# Patient Record
Sex: Female | Born: 1980 | Race: White | Hispanic: No | Marital: Married | Smoking: Current every day smoker
Health system: Southern US, Community
[De-identification: ages and names within clinical notes are randomized; demographics above are authoritative.]

## PROBLEM LIST (undated history)

## (undated) ENCOUNTER — Inpatient Hospital Stay (HOSPITAL_COMMUNITY): Payer: Self-pay

## (undated) DIAGNOSIS — Z8759 Personal history of other complications of pregnancy, childbirth and the puerperium: Secondary | ICD-10-CM

## (undated) DIAGNOSIS — F329 Major depressive disorder, single episode, unspecified: Secondary | ICD-10-CM

## (undated) DIAGNOSIS — F32A Depression, unspecified: Secondary | ICD-10-CM

## (undated) DIAGNOSIS — Z972 Presence of dental prosthetic device (complete) (partial): Secondary | ICD-10-CM

## (undated) DIAGNOSIS — Q613 Polycystic kidney, unspecified: Secondary | ICD-10-CM

## (undated) DIAGNOSIS — N83209 Unspecified ovarian cyst, unspecified side: Secondary | ICD-10-CM

## (undated) HISTORY — DX: Unspecified ovarian cyst, unspecified side: N83.209

---

## 1999-03-26 ENCOUNTER — Inpatient Hospital Stay (HOSPITAL_COMMUNITY): Admission: AD | Admit: 1999-03-26 | Discharge: 1999-03-26 | Payer: Self-pay | Admitting: *Deleted

## 1999-05-01 ENCOUNTER — Emergency Department (HOSPITAL_COMMUNITY): Admission: EM | Admit: 1999-05-01 | Discharge: 1999-05-01 | Payer: Self-pay | Admitting: Emergency Medicine

## 1999-09-28 ENCOUNTER — Inpatient Hospital Stay (HOSPITAL_COMMUNITY): Admission: AD | Admit: 1999-09-28 | Discharge: 1999-09-30 | Payer: Self-pay | Admitting: Obstetrics & Gynecology

## 2000-06-17 ENCOUNTER — Inpatient Hospital Stay (HOSPITAL_COMMUNITY): Admission: AD | Admit: 2000-06-17 | Discharge: 2000-06-17 | Payer: Self-pay | Admitting: *Deleted

## 2000-12-13 ENCOUNTER — Inpatient Hospital Stay (HOSPITAL_COMMUNITY): Admission: AD | Admit: 2000-12-13 | Discharge: 2000-12-13 | Payer: Self-pay | Admitting: *Deleted

## 2000-12-13 ENCOUNTER — Encounter: Payer: Self-pay | Admitting: *Deleted

## 2000-12-22 ENCOUNTER — Inpatient Hospital Stay (HOSPITAL_COMMUNITY): Admission: AD | Admit: 2000-12-22 | Discharge: 2000-12-22 | Payer: Self-pay | Admitting: Obstetrics & Gynecology

## 2000-12-28 ENCOUNTER — Inpatient Hospital Stay (HOSPITAL_COMMUNITY): Admission: AD | Admit: 2000-12-28 | Discharge: 2000-12-28 | Payer: Self-pay | Admitting: Obstetrics

## 2001-01-05 ENCOUNTER — Encounter: Payer: Self-pay | Admitting: Obstetrics & Gynecology

## 2001-01-05 ENCOUNTER — Inpatient Hospital Stay (HOSPITAL_COMMUNITY): Admission: AD | Admit: 2001-01-05 | Discharge: 2001-01-05 | Payer: Self-pay | Admitting: Obstetrics & Gynecology

## 2001-01-06 ENCOUNTER — Inpatient Hospital Stay (HOSPITAL_COMMUNITY): Admission: AD | Admit: 2001-01-06 | Discharge: 2001-01-06 | Payer: Self-pay | Admitting: Obstetrics & Gynecology

## 2001-01-09 ENCOUNTER — Inpatient Hospital Stay (HOSPITAL_COMMUNITY): Admission: AD | Admit: 2001-01-09 | Discharge: 2001-01-09 | Payer: Self-pay | Admitting: Obstetrics & Gynecology

## 2001-01-11 ENCOUNTER — Inpatient Hospital Stay (HOSPITAL_COMMUNITY): Admission: AD | Admit: 2001-01-11 | Discharge: 2001-01-13 | Payer: Self-pay | Admitting: *Deleted

## 2001-02-07 ENCOUNTER — Inpatient Hospital Stay (HOSPITAL_COMMUNITY): Admission: AD | Admit: 2001-02-07 | Discharge: 2001-02-07 | Payer: Self-pay | Admitting: *Deleted

## 2001-04-05 ENCOUNTER — Encounter: Admission: RE | Admit: 2001-04-05 | Discharge: 2001-04-05 | Payer: Self-pay | Admitting: Psychiatry

## 2001-05-31 ENCOUNTER — Encounter: Admission: RE | Admit: 2001-05-31 | Discharge: 2001-05-31 | Payer: Self-pay | Admitting: *Deleted

## 2001-09-04 ENCOUNTER — Emergency Department (HOSPITAL_COMMUNITY): Admission: EM | Admit: 2001-09-04 | Discharge: 2001-09-04 | Payer: Self-pay | Admitting: *Deleted

## 2001-09-27 ENCOUNTER — Encounter: Admission: RE | Admit: 2001-09-27 | Discharge: 2001-09-27 | Payer: Self-pay | Admitting: *Deleted

## 2002-05-10 ENCOUNTER — Emergency Department (HOSPITAL_COMMUNITY): Admission: EM | Admit: 2002-05-10 | Discharge: 2002-05-10 | Payer: Self-pay | Admitting: Emergency Medicine

## 2002-05-18 ENCOUNTER — Emergency Department (HOSPITAL_COMMUNITY): Admission: EM | Admit: 2002-05-18 | Discharge: 2002-05-18 | Payer: Self-pay | Admitting: Emergency Medicine

## 2002-05-25 ENCOUNTER — Encounter: Admission: RE | Admit: 2002-05-25 | Discharge: 2002-05-25 | Payer: Self-pay | Admitting: *Deleted

## 2002-06-23 ENCOUNTER — Emergency Department (HOSPITAL_COMMUNITY): Admission: EM | Admit: 2002-06-23 | Discharge: 2002-06-23 | Payer: Self-pay

## 2002-08-24 ENCOUNTER — Encounter: Admission: RE | Admit: 2002-08-24 | Discharge: 2002-08-24 | Payer: Self-pay | Admitting: Psychiatry

## 2003-05-13 ENCOUNTER — Encounter: Admission: RE | Admit: 2003-05-13 | Discharge: 2003-06-26 | Payer: Self-pay | Admitting: Orthopedic Surgery

## 2003-08-10 ENCOUNTER — Ambulatory Visit (HOSPITAL_COMMUNITY): Admission: RE | Admit: 2003-08-10 | Discharge: 2003-08-10 | Payer: Self-pay | Admitting: Orthopedic Surgery

## 2003-11-12 ENCOUNTER — Emergency Department (HOSPITAL_COMMUNITY): Admission: EM | Admit: 2003-11-12 | Discharge: 2003-11-12 | Payer: Self-pay | Admitting: Family Medicine

## 2003-11-26 ENCOUNTER — Ambulatory Visit (HOSPITAL_COMMUNITY): Payer: Self-pay | Admitting: Psychiatry

## 2003-12-29 ENCOUNTER — Emergency Department (HOSPITAL_COMMUNITY): Admission: EM | Admit: 2003-12-29 | Discharge: 2003-12-29 | Payer: Self-pay | Admitting: Family Medicine

## 2004-01-01 ENCOUNTER — Emergency Department (HOSPITAL_COMMUNITY): Admission: EM | Admit: 2004-01-01 | Discharge: 2004-01-02 | Payer: Self-pay | Admitting: Emergency Medicine

## 2004-02-11 ENCOUNTER — Observation Stay (HOSPITAL_COMMUNITY): Admission: RE | Admit: 2004-02-11 | Discharge: 2004-02-11 | Payer: Self-pay | Admitting: General Surgery

## 2004-02-11 HISTORY — PX: CHOLECYSTECTOMY: SHX55

## 2004-07-19 ENCOUNTER — Emergency Department (HOSPITAL_COMMUNITY): Admission: EM | Admit: 2004-07-19 | Discharge: 2004-07-19 | Payer: Self-pay | Admitting: Emergency Medicine

## 2004-10-07 ENCOUNTER — Ambulatory Visit (HOSPITAL_COMMUNITY): Payer: Self-pay | Admitting: Psychiatry

## 2004-10-14 ENCOUNTER — Emergency Department (HOSPITAL_COMMUNITY): Admission: EM | Admit: 2004-10-14 | Discharge: 2004-10-15 | Payer: Self-pay | Admitting: Emergency Medicine

## 2006-03-05 IMAGING — RF DG CHOLANGIOGRAM OPERATIVE
1 series · 17 of 17 positions shown · non-contrast
Comparison: none

CLINICAL DATA: 24 year old with gallstones. 
 INTRAOPERATIVE CHOLANGIOGRAM:
 Multiple fluoroscopic spot images from an intraoperative cholangiogram demonstrate cannulation of the cystic duct. The common bile duct is within normal limits in caliber.  It tapers normally to the duodenum where there is contrast entering the duodenum.  No abnormality involving the visualized intrahepatic ducts.  No common bile duct stones are seen.

[Series 1: run · 17 of 17 slices shown]
[im 1/17]
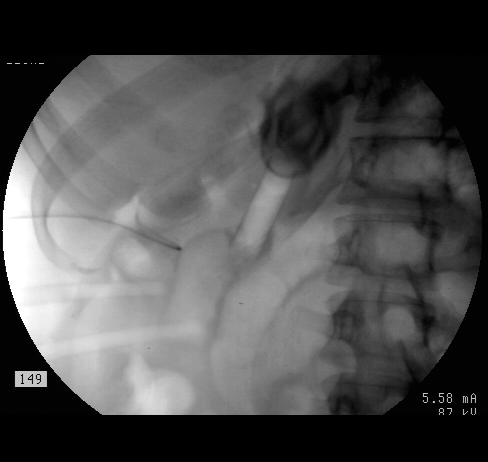
[im 2/17]
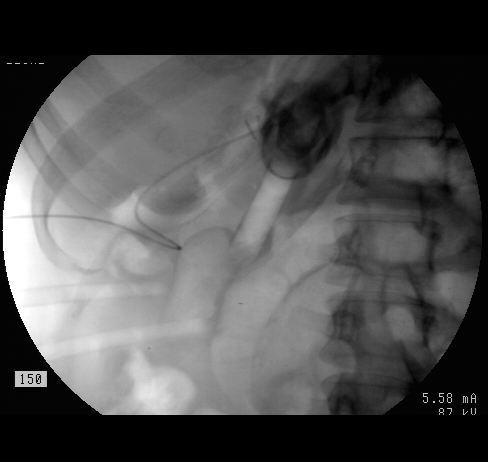
[im 3/17]
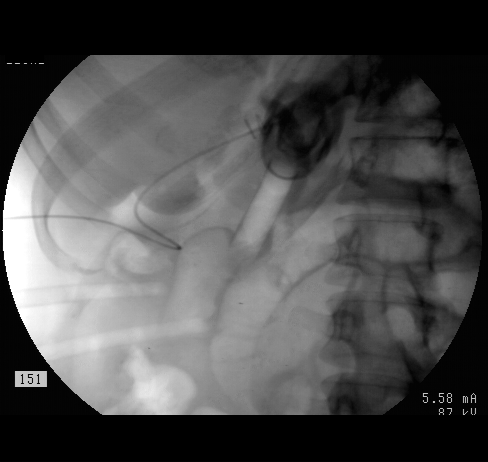
[im 4/17]
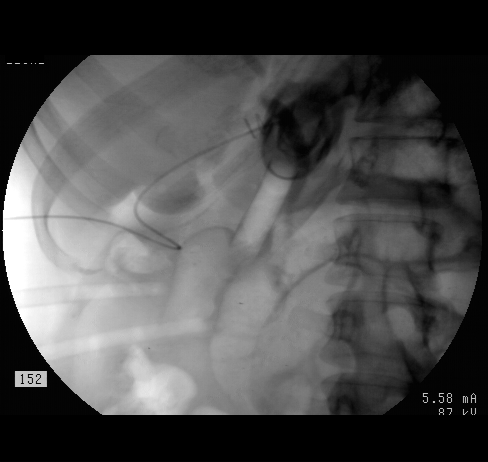
[im 5/17]
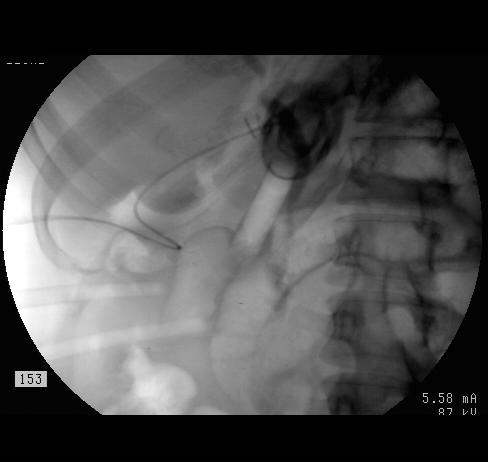
[im 6/17]
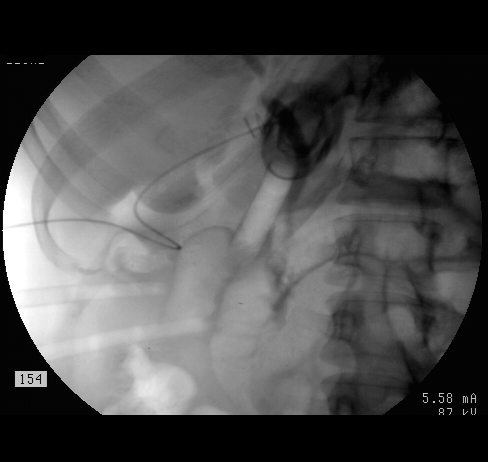
[im 7/17]
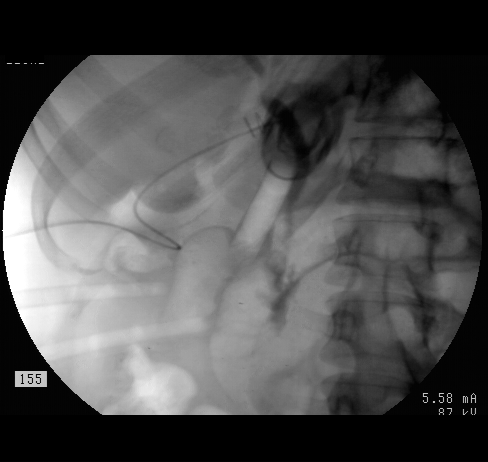
[im 8/17]
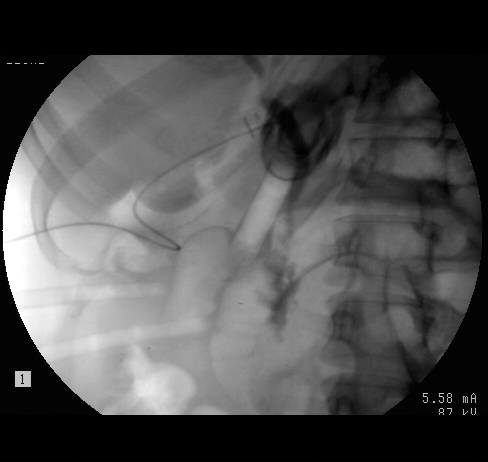
[im 9/17]
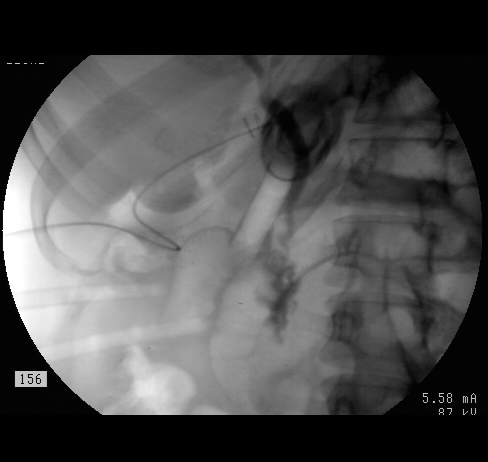
[im 10/17]
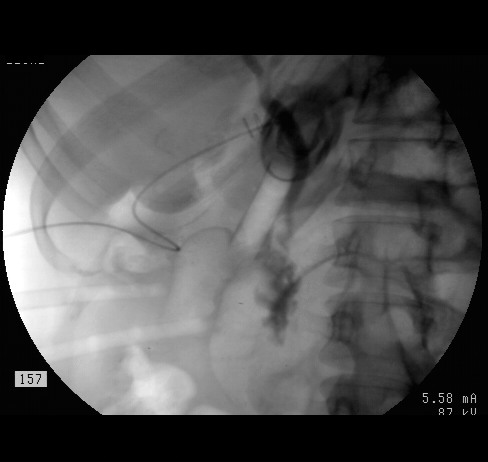
[im 11/17]
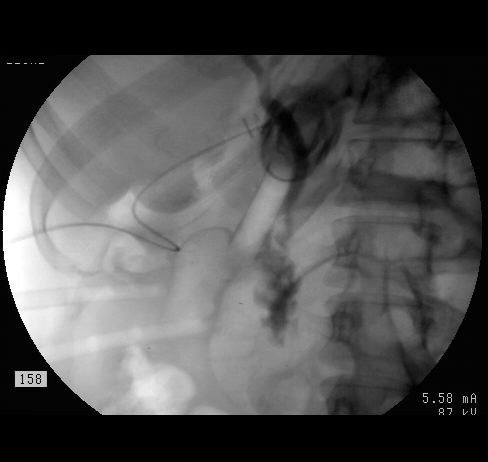
[im 12/17]
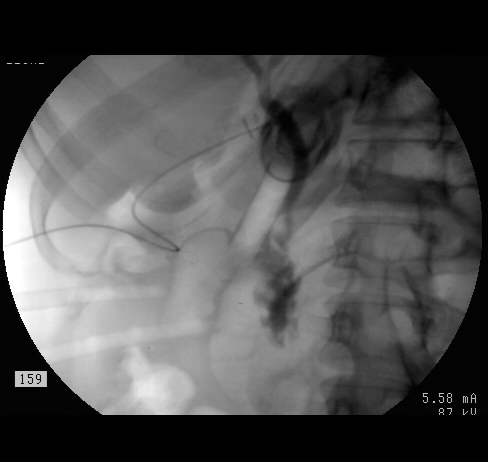
[im 13/17]
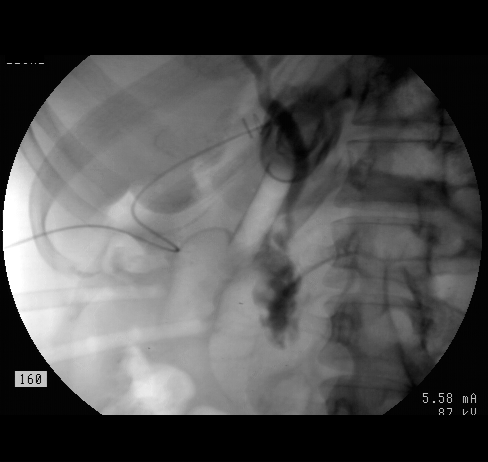
[im 14/17]
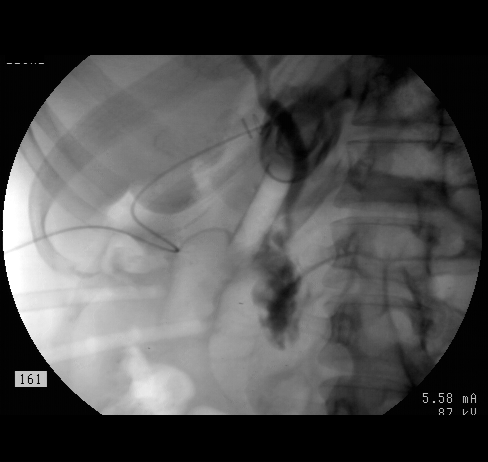
[im 15/17]
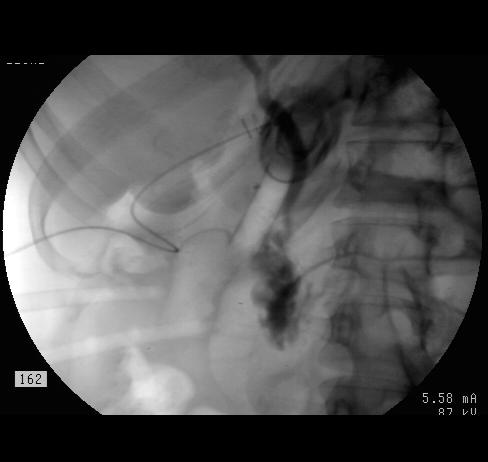
[im 16/17]
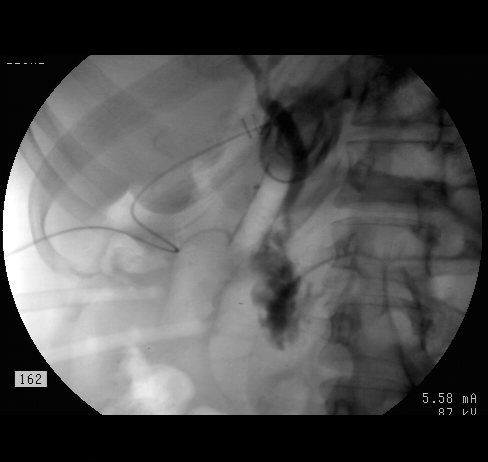
[im 17/17]
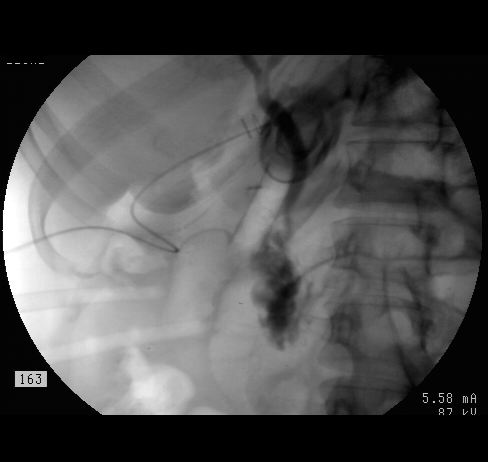

[17 of 17 positions shown; findings below may reference images not displayed]

IMPRESSION: Normal intraoperative cholangiogram.

## 2017-01-11 NOTE — L&D Delivery Note (Addendum)
Delivery Note At 12:08 PM a viable female was delivered via Vaginal, Spontaneous (Presentation: LOA ).  APGAR: 8, 9; weight pending  .   Placenta status: manual , complete .  Cord:  with the following complications: approx 20 mins after the delivery of the infant placenta had not delivered. Patient informs me that after her last birth her placenta didn't come, and had to be "scraped out". Dr Penne LashLeggett notified to come to the bedside to assist with placenta delivery and possible complications. Dr. Penne LashLeggett arrived. Placenta manually removed, and patient started to have heavy bleeding. Approx 1000 cc of clots and blood noted. PPH code called. Cytotec, hemabate given. Bakri balloon placed in the room. OR called to be on hold for the patient. Crossed for 2 units of blood.   Cord pH: NA  Anesthesia:  Epidural Episiotomy: None Lacerations: None Suture Repair: N/A Est. Blood Loss (mL):  1500 cc  Mom to postpartum.  Baby to Couplet care / Skin to Skin.  Michelle [name] 07/08/2017, 12:52 PM   Called for placenta in situ for 25 minutes.  Manual removal of placenta.  Uterus clear of membranes and placental remnants.  Fundus was firm.  LUS boggy and increased bleeding.  Vigorous bimanual massage.  Foley catheter already in the bladder.  Hemobate and cytotec ordered and administered.  After several large clots and continued bleeding, a PPH code was called (see orders).  Bakri balloon placed and filled with 400 cc infused.  Bleeding stopped.  EBL 1500 per RN staff.  Will watch on L & D for several hours.     Michelle LincolnKelly Leggett, MD

## 2017-06-18 ENCOUNTER — Encounter (HOSPITAL_COMMUNITY): Payer: Self-pay | Admitting: *Deleted

## 2017-06-18 ENCOUNTER — Inpatient Hospital Stay (HOSPITAL_COMMUNITY)
Admission: AD | Admit: 2017-06-18 | Discharge: 2017-06-18 | Disposition: A | Discharge status: Home / Self Care | Payer: Medicaid Other | Source: Ambulatory Visit | Attending: Obstetrics & Gynecology | Admitting: Obstetrics & Gynecology

## 2017-06-18 DIAGNOSIS — N760 Acute vaginitis: Secondary | ICD-10-CM | POA: Diagnosis not present

## 2017-06-18 DIAGNOSIS — O4703 False labor before 37 completed weeks of gestation, third trimester: Secondary | ICD-10-CM | POA: Insufficient documentation

## 2017-06-18 DIAGNOSIS — O479 False labor, unspecified: Secondary | ICD-10-CM

## 2017-06-18 DIAGNOSIS — O99333 Smoking (tobacco) complicating pregnancy, third trimester: Secondary | ICD-10-CM | POA: Insufficient documentation

## 2017-06-18 DIAGNOSIS — O26893 Other specified pregnancy related conditions, third trimester: Secondary | ICD-10-CM | POA: Diagnosis present

## 2017-06-18 DIAGNOSIS — O23593 Infection of other part of genital tract in pregnancy, third trimester: Secondary | ICD-10-CM | POA: Diagnosis not present

## 2017-06-18 DIAGNOSIS — O9989 Other specified diseases and conditions complicating pregnancy, childbirth and the puerperium: Secondary | ICD-10-CM

## 2017-06-18 DIAGNOSIS — B9689 Other specified bacterial agents as the cause of diseases classified elsewhere: Secondary | ICD-10-CM | POA: Insufficient documentation

## 2017-06-18 DIAGNOSIS — Z3A36 36 weeks gestation of pregnancy: Secondary | ICD-10-CM | POA: Diagnosis not present

## 2017-06-18 HISTORY — DX: Major depressive disorder, single episode, unspecified: F32.9

## 2017-06-18 HISTORY — DX: Depression, unspecified: F32.A

## 2017-06-18 LAB — CBC
HCT: 34.9 % — ABNORMAL LOW (ref 36.0–46.0)
Hemoglobin: 12.3 g/dL (ref 12.0–15.0)
MCH: 33.8 pg (ref 26.0–34.0)
MCHC: 35.2 g/dL (ref 30.0–36.0)
MCV: 95.9 fL (ref 78.0–100.0)
Platelets: 182 10*3/uL (ref 150–400)
RBC: 3.64 MIL/uL — ABNORMAL LOW (ref 3.87–5.11)
RDW: 13.7 % (ref 11.5–15.5)
WBC: 9.2 10*3/uL (ref 4.0–10.5)

## 2017-06-18 LAB — COMPREHENSIVE METABOLIC PANEL
ALBUMIN: 2.6 g/dL — AB (ref 3.5–5.0)
ALT: 13 U/L — ABNORMAL LOW (ref 14–54)
ANION GAP: 9 (ref 5–15)
AST: 24 U/L (ref 15–41)
Alkaline Phosphatase: 151 U/L — ABNORMAL HIGH (ref 38–126)
BILIRUBIN TOTAL: 0.5 mg/dL (ref 0.3–1.2)
BUN: 8 mg/dL (ref 6–20)
CALCIUM: 8.7 mg/dL — AB (ref 8.9–10.3)
CO2: 20 mmol/L — ABNORMAL LOW (ref 22–32)
Chloride: 109 mmol/L (ref 101–111)
Creatinine, Ser: 0.53 mg/dL (ref 0.44–1.00)
GFR calc Af Amer: >60 mL/min (ref >60)
GFR calc non Af Amer: >60 mL/min (ref >60)
GLUCOSE: 92 mg/dL (ref 65–99)
Potassium: 3.5 mmol/L (ref 3.5–5.1)
Sodium: 138 mmol/L (ref 135–145)
TOTAL PROTEIN: 5.5 g/dL — AB (ref 6.5–8.1)

## 2017-06-18 LAB — URINALYSIS, ROUTINE W REFLEX MICROSCOPIC
Bilirubin Urine: NEGATIVE
GLUCOSE, UA: NEGATIVE mg/dL
Ketones, ur: NEGATIVE mg/dL
Nitrite: NEGATIVE
PROTEIN: 30 mg/dL — AB
Specific Gravity, Urine: 1.011 (ref 1.005–1.030)
pH: 7 (ref 5.0–8.0)

## 2017-06-18 LAB — PROTEIN / CREATININE RATIO, URINE
Creatinine, Urine: 83 mg/dL
Protein Creatinine Ratio: 0.36 mg/mg{Cre} — ABNORMAL HIGH (ref 0.00–0.15)
Total Protein, Urine: 30 mg/dL

## 2017-06-18 LAB — WET PREP, GENITAL
SPERM: NONE SEEN
Trich, Wet Prep: NONE SEEN
Yeast Wet Prep HPF POC: NONE SEEN

## 2017-06-18 LAB — AMNISURE RUPTURE OF MEMBRANE (ROM) NOT AT ARMC: Amnisure ROM: NEGATIVE

## 2017-06-18 MED ORDER — METRONIDAZOLE 500 MG PO TABS: 500.0000 mg | ORAL_TABLET | Freq: Two times a day (BID) | ORAL | 0 refills | Status: DC

## 2017-06-18 NOTE — Discharge Instructions (Signed)
Bacterial Vaginosis °Bacterial vaginosis is a vaginal infection that occurs when the normal balance of bacteria in the vagina is disrupted. It results from an overgrowth of certain bacteria. This is the most common vaginal infection among women ages 15-44. °Because bacterial vaginosis increases your risk for STIs (sexually transmitted infections), getting treated can help reduce your risk for chlamydia, gonorrhea, herpes, and HIV (human immunodeficiency virus). Treatment is also important for preventing complications in pregnant women, because this condition can cause an early (premature) delivery. °What are the causes? °This condition is caused by an increase in harmful bacteria that are normally present in small amounts in the vagina. However, the reason that the condition develops is not fully understood. °What increases the risk? °The following factors may make you more likely to develop this condition: °· Having a new sexual partner or multiple sexual partners. °· Having unprotected sex. °· Douching. °· Having an intrauterine device (IUD). °· Smoking. °· Drug and alcohol abuse. °· Taking certain antibiotic medicines. °· Being pregnant. ° °You cannot get bacterial vaginosis from toilet seats, bedding, swimming pools, or contact with objects around you. °What are the signs or symptoms? °Symptoms of this condition include: °· Grey or white vaginal discharge. The discharge can also be watery or foamy. °· A fish-like odor with discharge, especially after sexual intercourse or during menstruation. °· Itching in and around the vagina. °· Burning or pain with urination. ° °Some women with bacterial vaginosis have no signs or symptoms. °How is this diagnosed? °This condition is diagnosed based on: °· Your medical history. °· A physical exam of the vagina. °· Testing a sample of vaginal fluid under a microscope to look for a large amount of bad bacteria or abnormal cells. Your health care provider may use a cotton swab  or a small wooden spatula to collect the sample. ° °How is this treated? °This condition is treated with antibiotics. These may be given as a pill, a vaginal cream, or a medicine that is put into the vagina (suppository). If the condition comes back after treatment, a second round of antibiotics may be needed. °Follow these instructions at home: °Medicines °· Take over-the-counter and prescription medicines only as told by your health care provider. °· Take or use your antibiotic as told by your health care provider. Do not stop taking or using the antibiotic even if you start to feel better. °General instructions °· If you have a female sexual partner, tell her that you have a vaginal infection. She should see her health care provider and be treated if she has symptoms. If you have a female sexual partner, he does not need treatment. °· During treatment: °? Avoid sexual activity until you finish treatment. °? Do not douche. °? Avoid alcohol as directed by your health care provider. °? Avoid breastfeeding as directed by your health care provider. °· Drink enough water and fluids to keep your urine clear or pale yellow. °· Keep the area around your vagina and rectum clean. °? Wash the area daily with warm water. °? Wipe yourself from front to back after using the toilet. °· Keep all follow-up visits as told by your health care provider. This is important. °How is this prevented? °· Do not douche. °· Wash the outside of your vagina with warm water only. °· Use protection when having sex. This includes latex condoms and dental dams. °· Limit how many sexual partners you have. To help prevent bacterial vaginosis, it is best to have sex with just   one partner (monogamous). °· Make sure you and your sexual partner are tested for STIs. °· Wear cotton or cotton-lined underwear. °· Avoid wearing tight pants and pantyhose, especially during summer. °· Limit the amount of alcohol that you drink. °· Do not use any products that  contain nicotine or tobacco, such as cigarettes and e-cigarettes. If you need help quitting, ask your health care provider. °· Do not use illegal drugs. °Where to find more information: °· Centers for Disease Control and Prevention: www.cdc.gov/std °· American Sexual Health Association (ASHA): www.ashastd.org °· U.S. Department of Health and Human Services, Office on Women's Health: www.womenshealth.gov/ or https://www.womenshealth.gov/a-z-topics/bacterial-vaginosis °Contact a health care provider if: °· Your symptoms do not improve, even after treatment. °· You have more discharge or pain when urinating. °· You have a fever. °· You have pain in your abdomen. °· You have pain during sex. °· You have vaginal bleeding between periods. °Summary °· Bacterial vaginosis is a vaginal infection that occurs when the normal balance of bacteria in the vagina is disrupted. °· Because bacterial vaginosis increases your risk for STIs (sexually transmitted infections), getting treated can help reduce your risk for chlamydia, gonorrhea, herpes, and HIV (human immunodeficiency virus). Treatment is also important for preventing complications in pregnant women, because the condition can cause an early (premature) delivery. °· This condition is treated with antibiotic medicines. These may be given as a pill, a vaginal cream, or a medicine that is put into the vagina (suppository). °This information is not intended to replace advice given to you by your health care provider. Make sure you discuss any questions you have with your health care provider. °Document Released: 12/28/2004 Document Revised: 05/03/2016 Document Reviewed: 09/13/2015 °Elsevier Interactive Patient Education © 2018 [employer] ° °Safe Medications in Pregnancy  ° °Acne: °Benzoyl Peroxide °Salicylic Acid ° °Backache/Headache: °Tylenol: 2 regular strength every 4 hours OR °             2 Extra strength every 6 hours ° °Colds/Coughs/Allergies: °Benadryl (alcohol free)  25 mg every 6 hours as needed °Breath right strips °Claritin °Cepacol throat lozenges °Chloraseptic throat spray °Cold-Eeze- up to three times per day °Cough drops, alcohol free °Flonase (by prescription only) °Guaifenesin °Mucinex °Robitussin DM (plain only, alcohol free) °Saline nasal spray/drops °Sudafed (pseudoephedrine) & Actifed ** use only after [redacted] weeks gestation and if you do not have high blood pressure °Tylenol °Vicks Vaporub °Zinc lozenges °Zyrtec  ° °Constipation: °Colace °Ducolax suppositories °Fleet enema °Glycerin suppositories °Metamucil °Milk of magnesia °Miralax °Senokot °Smooth move tea ° °Diarrhea: °Kaopectate °Imodium A-D ° °*NO pepto Bismol ° °Hemorrhoids: °Anusol °Anusol HC °Preparation H °Tucks ° °Indigestion: °Tums °Maalox °Mylanta °Zantac  °Pepcid ° °Insomnia: °Benadryl (alcohol free) 25mg every 6 hours as needed °Tylenol PM °Unisom, no Gelcaps ° °Leg Cramps: °Tums °MagGel ° °Nausea/Vomiting:  °Bonine °Dramamine °Emetrol °Ginger extract °Sea bands °Meclizine  °Nausea medication to take during pregnancy:  °Unisom (doxylamine succinate 25 mg tablets) Take one tablet daily at bedtime. If symptoms are not adequately controlled, the dose can be increased to a maximum recommended dose of two tablets daily (1/2 tablet in the morning, 1/2 tablet mid-afternoon and one at bedtime). °Vitamin B6 100mg tablets. Take one tablet twice a day (up to 200 mg per day). ° °Skin Rashes: °Aveeno products °Benadryl cream or 25mg every 6 hours as needed °Calamine Lotion °1% cortisone cream ° °Yeast infection: °Gyne-lotrimin 7 °Monistat 7 ° ° °**If taking multiple medications, please check labels to avoid duplicating the same active   ingredients °**take medication as directed on the label °** Do not exceed 4000 mg of tylenol in 24 hours °**Do not take medications that contain aspirin or ibuprofen ° ° ° ° °

## 2017-06-18 NOTE — MAU Note (Signed)
Just moved to the area and plans to go [facility]. Waiting on appt there. Having a lot of pelvic pressure and lower abd pain. Leaking clear fld for 2 days

## 2017-06-18 NOTE — MAU Provider Note (Signed)
History     CSN: [id number]  Arrival date and time: 06/18/17 2051   First Provider Initiated Contact with Patient 06/18/17 2208     Chief Complaint  Patient presents with  . Pelvic Pain  . Vaginal Discharge  . Abdominal Pain   HPI  Michelle [name] is a 37 y.o. G4P3003 at 2925w1d who presents with pressure, contractions and leaking of fluid. She states she started leaking fluid that was clear like water yesterday and it has continued today. She states she is having increased pelvic pressure and occasional contractions. Denies bleeding. Reports good fetal movement. She recently moved from [place] and has not established prenatal care in this area. Plans to [facility]. She denies any problems in the pregnancy and has had 3 previous vaginal deliveries. She denies any headaches, visual changes or epigastric pain.   OB History    Gravida  4   Para  3   Term  3   Preterm      AB      Living  3     SAB      TAB      Ectopic      Multiple      Live Births  3           Past Medical History:  Diagnosis Date  . Chronic kidney disease   . Depression     Past Surgical History:  Procedure Laterality Date  . CHOLECYSTECTOMY      No family history on file.  Social History   Tobacco Use  . Smoking status: Current Every Day Smoker  . Smokeless tobacco: Never Used  Substance Use Topics  . Alcohol use: Not on file  . Drug use: Not on file    Allergies: No Known Allergies  No medications prior to admission.    Review of Systems  Constitutional: Negative.  Negative for fatigue and fever.  HENT: Negative.   Eyes: Negative for visual disturbance.  Respiratory: Negative.  Negative for shortness of breath.   Cardiovascular: Negative.  Negative for chest pain.  Gastrointestinal: Positive for abdominal pain. Negative for constipation, diarrhea, nausea and vomiting.  Genitourinary: Positive for pelvic pain and vaginal discharge. Negative for dysuria and  vaginal bleeding.  Neurological: Negative.  Negative for dizziness and headaches.   Physical Exam   Blood pressure 123/74, pulse 77, temperature 98.3 F (36.8 C), resp. rate 18, height 5' (1.524 m), weight 170 lb (77.1 kg).  Patient Vitals for the past 24 hrs:  BP Temp Pulse Resp Height Weight  06/18/17 2245 123/82 - 85 - - -  06/18/17 2231 116/72 - 85 - - -  06/18/17 2201 121/71 - 72 - - -  06/18/17 2146 123/74 - 77 - - -  06/18/17 2132 (!) 107/92 - 82 - - -  06/18/17 2107 (!) 145/79 - 76 - - -  06/18/17 2106 - 98.3 F (36.8 C) - 18 5' (1.524 m) 170 lb (77.1 kg)   Physical Exam  Fetal Tracing:  Baseline: 125 Variability: moderate Accels: 15x15 Decels: none  Toco: none  Dilation: Fingertip Effacement (%): Thick Cervical Position: Posterior Exam by:: Cleone Slimaroline Neill CNM   MAU Course  Procedures Results for orders placed or performed during the hospital encounter of 06/18/17 (from the past 24 hour(s))  Urinalysis, Routine w reflex microscopic     Status: Abnormal   Collection Time: 06/18/17  9:12 PM  Result Value Ref Range   Color, Urine YELLOW YELLOW  APPearance HAZY (A) CLEAR   Specific Gravity, Urine 1.011 1.005 - 1.030   pH 7.0 5.0 - 8.0   Glucose, UA NEGATIVE NEGATIVE mg/dL   Hgb urine dipstick MODERATE (A) NEGATIVE   Bilirubin Urine NEGATIVE NEGATIVE   Ketones, ur NEGATIVE NEGATIVE mg/dL   Protein, ur 30 (A) NEGATIVE mg/dL   Nitrite NEGATIVE NEGATIVE   Leukocytes, UA MODERATE (A) NEGATIVE   RBC / HPF 11-20 0 - 5 RBC/hpf   WBC, UA 21-50 0 - 5 WBC/hpf   Bacteria, UA MANY (A) NONE SEEN   Squamous Epithelial / LPF 0-5 0 - 5   Mucus PRESENT   Protein / creatinine ratio, urine     Status: Abnormal   Collection Time: 06/18/17  9:39 PM  Result Value Ref Range   Creatinine, Urine 83.00 mg/dL   Total Protein, Urine 30 mg/dL   Protein Creatinine Ratio 0.36 (H) 0.00 - 0.15 mg/mg[Cre]  CBC     Status: Abnormal   Collection Time: 06/18/17  9:57 PM  Result Value  Ref Range   WBC 9.2 4.0 - 10.5 K/uL   RBC 3.64 (L) 3.87 - 5.11 MIL/uL   Hemoglobin 12.3 12.0 - 15.0 g/dL   HCT 09.6 (L) 04.5 - 40.9 %   MCV 95.9 78.0 - 100.0 fL   MCH 33.8 26.0 - 34.0 pg   MCHC 35.2 30.0 - 36.0 g/dL   RDW 81.1 91.4 - 78.2 %   Platelets 182 150 - 400 K/uL  Comprehensive metabolic panel     Status: Abnormal   Collection Time: 06/18/17  9:57 PM  Result Value Ref Range   Sodium 138 135 - 145 mmol/L   Potassium 3.5 3.5 - 5.1 mmol/L   Chloride 109 101 - 111 mmol/L   CO2 20 (L) 22 - 32 mmol/L   Glucose, Bld 92 65 - 99 mg/dL   BUN 8 6 - 20 mg/dL   Creatinine, Ser 9.56 0.44 - 1.00 mg/dL   Calcium 8.7 (L) 8.9 - 10.3 mg/dL   Total Protein 5.5 (L) 6.5 - 8.1 g/dL   Albumin 2.6 (L) 3.5 - 5.0 g/dL   AST 24 15 - 41 U/L   ALT 13 (L) 14 - 54 U/L   Alkaline Phosphatase 151 (H) 38 - 126 U/L   Total Bilirubin 0.5 0.3 - 1.2 mg/dL   GFR calc non Af Amer >60 >60 mL/min   GFR calc Af Amer >60 >60 mL/min   Anion gap 9 5 - 15  Wet prep, genital     Status: Abnormal   Collection Time: 06/18/17 10:19 PM  Result Value Ref Range   Yeast Wet Prep HPF POC NONE SEEN NONE SEEN   Trich, Wet Prep NONE SEEN NONE SEEN   Clue Cells Wet Prep HPF POC PRESENT (A) NONE SEEN   WBC, Wet Prep HPF POC MODERATE (A) NONE SEEN   Sperm NONE SEEN   Amnisure rupture of membrane (rom)not at [facility]     Status: None   Collection Time: 06/18/17 10:19 PM  Result Value Ref Range   Amnisure ROM NEGATIVE    MDM UA, UC CBC, CMP, PCR Amnisure Group B Strep Wet prep and gc/chlamydia  Results reviewed with Dr. Debroah Loop- ok to discharge patient home with preeclampsia precautions.   Assessment and Plan   1. Bacterial vaginosis   2. [redacted] weeks gestation of pregnancy   3. Braxton Hick's contraction    -Discharge home in stable condition -Rx for metronidazole given to patient -Preterm  labor and preeclampsia precautions discussed -Patient advised to follow-up with [facility]  to start prenatal care -Patient  may return to MAU as needed or if her condition were to change or worsen  Rolm Bookbinder CNM 06/18/2017, 10:08 PM

## 2017-06-18 NOTE — Progress Notes (Signed)
G4P3 @ 36.[redacted] wksga. Here dt LOF clear past two days. Just moved to [place] and plans to deliver with [facility] and pending appt with them. No records.   EFM applied.  Fern test:

## 2017-06-18 NOTE — MAU Note (Addendum)
Pt reports leaking of clear fluid and pelvic pressure x2 days. Pt reports intermittent cramping, reports good fetal movement. Denies vaginal bleeding. [place]

## 2017-06-20 LAB — GC/CHLAMYDIA PROBE AMP (~~LOC~~) NOT AT ARMC
CHLAMYDIA, DNA PROBE: NEGATIVE
NEISSERIA GONORRHEA: NEGATIVE

## 2017-06-21 LAB — CULTURE, BETA STREP (GROUP B ONLY)

## 2017-06-21 LAB — CULTURE, OB URINE

## 2017-06-22 ENCOUNTER — Telehealth: Payer: Self-pay | Admitting: Student

## 2017-06-22 DIAGNOSIS — O2343 Unspecified infection of urinary tract in pregnancy, third trimester: Secondary | ICD-10-CM

## 2017-06-22 MED ORDER — CEPHALEXIN 500 MG PO CAPS: 500.0000 mg | ORAL_CAPSULE | Freq: Four times a day (QID) | ORAL | 0 refills | Status: DC

## 2017-06-22 NOTE — Telephone Encounter (Signed)
Verified by name & DOB. Notified of positive urine culture. NKDA. Keflex sent to pharmacy.   Judeth HornLawrence, Kearney Evitt, NP

## 2017-06-23 ENCOUNTER — Other Ambulatory Visit (HOSPITAL_COMMUNITY)
Admission: RE | Admit: 2017-06-23 | Discharge: 2017-06-23 | Disposition: A | Discharge status: Home / Self Care | Payer: Medicaid Other | Source: Ambulatory Visit | Attending: Obstetrics & Gynecology | Admitting: Obstetrics & Gynecology

## 2017-06-23 ENCOUNTER — Encounter: Payer: Self-pay | Admitting: Obstetrics & Gynecology

## 2017-06-23 ENCOUNTER — Ambulatory Visit (INDEPENDENT_AMBULATORY_CARE_PROVIDER_SITE_OTHER): Payer: Medicaid Other | Admitting: Obstetrics & Gynecology

## 2017-06-23 DIAGNOSIS — Z3A00 Weeks of gestation of pregnancy not specified: Secondary | ICD-10-CM | POA: Insufficient documentation

## 2017-06-23 DIAGNOSIS — Z3483 Encounter for supervision of other normal pregnancy, third trimester: Secondary | ICD-10-CM

## 2017-06-23 DIAGNOSIS — Z3480 Encounter for supervision of other normal pregnancy, unspecified trimester: Secondary | ICD-10-CM | POA: Diagnosis present

## 2017-06-23 NOTE — Progress Notes (Signed)
  Subjective:    Lerry LinerJessica R Vannote is being seen today for her first obstetrical visit.  This is not a planned pregnancy. She is at 7085w6d gestation. Her obstetrical history is significant for advanced maternal age. Patient does not intend to breast feed. Pregnancy history fully reviewed.  Patient reports no complaints.  Review of Systems:   Review of Systems  Objective:     BP 130/83   Pulse 83   Wt 169 lb (76.7 kg)   BMI 33.01 kg/m  Physical Exam  Exam Heart- rrr Lungs- CTAB Abd- benign, gravid, FH 36 Cervix- 2/80/-2   Assessment:    Pregnancy: [id number] Patient Active Problem List   Diagnosis Date Noted  . Supervision of other normal pregnancy, antepartum 06/23/2017       Plan:     Initial labs drawn. Prenatal vitamins. Problem list reviewed and updated. Cervical cultures done today Labor precautions given Follow up in 1 weeks.   Allie BossierMyra C Kaheem Halleck 06/23/2017

## 2017-06-24 LAB — CERVICOVAGINAL ANCILLARY ONLY
CHLAMYDIA, DNA PROBE: NEGATIVE
Neisseria Gonorrhea: NEGATIVE

## 2017-06-26 LAB — CULTURE, BETA STREP (GROUP B ONLY)
MICRO NUMBER: 90710799
SPECIMEN QUALITY: ADEQUATE

## 2017-06-27 ENCOUNTER — Encounter: Payer: Self-pay | Admitting: *Deleted

## 2017-06-27 DIAGNOSIS — Z3480 Encounter for supervision of other normal pregnancy, unspecified trimester: Secondary | ICD-10-CM

## 2017-06-29 ENCOUNTER — Encounter: Payer: Self-pay | Admitting: Obstetrics and Gynecology

## 2017-06-29 ENCOUNTER — Ambulatory Visit (INDEPENDENT_AMBULATORY_CARE_PROVIDER_SITE_OTHER): Payer: Medicaid Other | Admitting: Obstetrics and Gynecology

## 2017-06-29 VITALS — BP 132/80 | HR 85 | Wt 172.0 lb

## 2017-06-29 DIAGNOSIS — Z3483 Encounter for supervision of other normal pregnancy, third trimester: Secondary | ICD-10-CM | POA: Diagnosis not present

## 2017-06-29 DIAGNOSIS — O09529 Supervision of elderly multigravida, unspecified trimester: Secondary | ICD-10-CM | POA: Insufficient documentation

## 2017-06-29 DIAGNOSIS — O09523 Supervision of elderly multigravida, third trimester: Secondary | ICD-10-CM | POA: Diagnosis not present

## 2017-06-29 DIAGNOSIS — Z3480 Encounter for supervision of other normal pregnancy, unspecified trimester: Secondary | ICD-10-CM

## 2017-06-29 NOTE — Progress Notes (Signed)
   PRENATAL VISIT NOTE  Subjective:  Michelle LinerJessica R [name] is a 37 y.o. G4P3003 at 3629w5d being seen today for ongoing prenatal care.  She is currently monitored for the following issues for this high-risk pregnancy and has Supervision of other normal pregnancy, antepartum on their problem list.  Patient reports no complaints.  Contractions: Irritability. Vag. Bleeding: None.  Movement: Present. Denies leaking of fluid.   The following portions of the patient's history were reviewed and updated as appropriate: allergies, current medications, past family history, past medical history, past social history, past surgical history and problem list. Problem list updated.  Objective:   Vitals:   06/29/17 1136  BP: 132/80  Pulse: 85  Weight: 172 lb (78 kg)    Fetal Status: Fetal Heart Rate (bpm): 141 Fundal Height: 38 cm Movement: Present  Presentation: Vertex  General:  Alert, oriented and cooperative. Patient is in no acute distress.  Skin: Skin is warm and dry. No rash noted.   Cardiovascular: Normal heart rate noted  Respiratory: Normal respiratory effort, no problems with respiration noted  Abdomen: Soft, gravid, appropriate for gestational age.  Pain/Pressure: Present     Pelvic: Cervical exam performed Dilation: 2 Effacement (%): 80 Station: -3  Extremities: Normal range of motion.  Edema: Mild pitting, slight indentation  Mental Status: Normal mood and affect. Normal behavior. Normal judgment and thought content.   Assessment and Plan:  Pregnancy: G4P3003 at 4729w5d  1. Supervision of other normal pregnancy, antepartum Patient is doing well without complaints   Term labor symptoms and general obstetric precautions including but not limited to vaginal bleeding, contractions, leaking of fluid and fetal movement were reviewed in detail with the patient. Please refer to After Visit Summary for other counseling recommendations.  Return in about 1 week (around 07/06/2017) for ROB.  No  future appointments.  Catalina AntiguaPeggy Madylyn Insco, MD

## 2017-07-08 ENCOUNTER — Encounter: Payer: Medicaid Other | Admitting: Advanced Practice Midwife

## 2017-07-08 ENCOUNTER — Other Ambulatory Visit: Payer: Self-pay

## 2017-07-08 ENCOUNTER — Inpatient Hospital Stay (HOSPITAL_COMMUNITY): Payer: Medicaid Other | Admitting: Certified Registered Nurse Anesthetist

## 2017-07-08 ENCOUNTER — Encounter (HOSPITAL_COMMUNITY)
Admission: AD | Disposition: A | Discharge status: Home / Self Care | Payer: Self-pay | Source: Home / Self Care | Attending: Obstetrics & Gynecology

## 2017-07-08 ENCOUNTER — Inpatient Hospital Stay (HOSPITAL_COMMUNITY)
Admission: AD | Admit: 2017-07-08 | Discharge: 2017-07-10 | DRG: 768 | Disposition: A | Discharge status: Home / Self Care | Payer: Medicaid Other | Attending: Obstetrics & Gynecology | Admitting: Obstetrics & Gynecology

## 2017-07-08 ENCOUNTER — Inpatient Hospital Stay (HOSPITAL_COMMUNITY): Payer: Medicaid Other | Admitting: Anesthesiology

## 2017-07-08 ENCOUNTER — Encounter (HOSPITAL_COMMUNITY): Payer: Self-pay

## 2017-07-08 DIAGNOSIS — Z3A39 39 weeks gestation of pregnancy: Secondary | ICD-10-CM | POA: Diagnosis not present

## 2017-07-08 DIAGNOSIS — D62 Acute posthemorrhagic anemia: Secondary | ICD-10-CM | POA: Diagnosis not present

## 2017-07-08 DIAGNOSIS — Z3480 Encounter for supervision of other normal pregnancy, unspecified trimester: Secondary | ICD-10-CM

## 2017-07-08 DIAGNOSIS — O09523 Supervision of elderly multigravida, third trimester: Secondary | ICD-10-CM

## 2017-07-08 DIAGNOSIS — O1494 Unspecified pre-eclampsia, complicating childbirth: Secondary | ICD-10-CM | POA: Diagnosis present

## 2017-07-08 DIAGNOSIS — O134 Gestational [pregnancy-induced] hypertension without significant proteinuria, complicating childbirth: Secondary | ICD-10-CM | POA: Diagnosis not present

## 2017-07-08 DIAGNOSIS — O9081 Anemia of the puerperium: Secondary | ICD-10-CM | POA: Diagnosis not present

## 2017-07-08 DIAGNOSIS — Z3482 Encounter for supervision of other normal pregnancy, second trimester: Secondary | ICD-10-CM | POA: Diagnosis present

## 2017-07-08 DIAGNOSIS — O99334 Smoking (tobacco) complicating childbirth: Secondary | ICD-10-CM | POA: Diagnosis present

## 2017-07-08 DIAGNOSIS — O09529 Supervision of elderly multigravida, unspecified trimester: Secondary | ICD-10-CM

## 2017-07-08 LAB — COMPREHENSIVE METABOLIC PANEL
ALBUMIN: 2.8 g/dL — AB (ref 3.5–5.0)
ALT: 14 U/L (ref 0–44)
AST: 25 U/L (ref 15–41)
Alkaline Phosphatase: 194 U/L — ABNORMAL HIGH (ref 38–126)
Anion gap: 11 (ref 5–15)
BUN: 10 mg/dL (ref 6–20)
CHLORIDE: 104 mmol/L (ref 98–111)
CO2: 18 mmol/L — AB (ref 22–32)
CREATININE: 0.45 mg/dL (ref 0.44–1.00)
Calcium: 8.2 mg/dL — ABNORMAL LOW (ref 8.9–10.3)
GFR calc Af Amer: >60 mL/min (ref >60)
GFR calc non Af Amer: >60 mL/min (ref >60)
GLUCOSE: 75 mg/dL (ref 70–99)
Potassium: 3.5 mmol/L (ref 3.5–5.1)
SODIUM: 133 mmol/L — AB (ref 135–145)
Total Bilirubin: 0.7 mg/dL (ref 0.3–1.2)
Total Protein: 5.8 g/dL — ABNORMAL LOW (ref 6.5–8.1)

## 2017-07-08 LAB — URINALYSIS, ROUTINE W REFLEX MICROSCOPIC
BILIRUBIN URINE: NEGATIVE
GLUCOSE, UA: NEGATIVE mg/dL
Ketones, ur: NEGATIVE mg/dL
NITRITE: NEGATIVE
Protein, ur: 100 mg/dL — AB
SPECIFIC GRAVITY, URINE: 1.01 (ref 1.005–1.030)
pH: 7 (ref 5.0–8.0)

## 2017-07-08 LAB — PROTIME-INR
INR: 1.01
Prothrombin Time: 13.2 seconds (ref 11.4–15.2)

## 2017-07-08 LAB — CBC
HCT: 33.5 % — ABNORMAL LOW (ref 36.0–46.0)
HEMATOCRIT: 39.1 % (ref 36.0–46.0)
HEMATOCRIT: 31.8 % — AB (ref 36.0–46.0)
Hemoglobin: 13.4 g/dL (ref 12.0–15.0)
Hemoglobin: 11.7 g/dL — ABNORMAL LOW (ref 12.0–15.0)
Hemoglobin: 11.1 g/dL — ABNORMAL LOW (ref 12.0–15.0)
MCH: 33 pg (ref 26.0–34.0)
MCH: 33.9 pg (ref 26.0–34.0)
MCH: 33.7 pg (ref 26.0–34.0)
MCHC: 34.3 g/dL (ref 30.0–36.0)
MCHC: 34.9 g/dL (ref 30.0–36.0)
MCHC: 34.9 g/dL (ref 30.0–36.0)
MCV: 96.3 fL (ref 78.0–100.0)
MCV: 97.1 fL (ref 78.0–100.0)
MCV: 96.7 fL (ref 78.0–100.0)
PLATELETS: 155 10*3/uL (ref 150–400)
Platelets: 172 10*3/uL (ref 150–400)
Platelets: 156 10*3/uL (ref 150–400)
RBC: 4.06 MIL/uL (ref 3.87–5.11)
RBC: 3.45 MIL/uL — ABNORMAL LOW (ref 3.87–5.11)
RBC: 3.29 MIL/uL — ABNORMAL LOW (ref 3.87–5.11)
RDW: 13.6 % (ref 11.5–15.5)
RDW: 13.9 % (ref 11.5–15.5)
RDW: 13.8 % (ref 11.5–15.5)
WBC: 10.6 10*3/uL — ABNORMAL HIGH (ref 4.0–10.5)
WBC: 9.7 10*3/uL (ref 4.0–10.5)
WBC: 22.2 10*3/uL — AB (ref 4.0–10.5)

## 2017-07-08 LAB — DIC (DISSEMINATED INTRAVASCULAR COAGULATION) PANEL
PROTHROMBIN TIME: 15.3 s — AB (ref 11.4–15.2)
SMEAR REVIEW: NONE SEEN

## 2017-07-08 LAB — DIC (DISSEMINATED INTRAVASCULAR COAGULATION)PANEL
D-Dimer, Quant: 1.55 ug/mL-FEU — ABNORMAL HIGH (ref 0.00–0.50)
Fibrinogen: 242 mg/dL (ref 210–475)
INR: 1.22
Platelets: 153 10*3/uL (ref 150–400)
aPTT: 36 seconds (ref 24–36)

## 2017-07-08 LAB — RPR: RPR Ser Ql: NONREACTIVE

## 2017-07-08 LAB — PREPARE RBC (CROSSMATCH)

## 2017-07-08 LAB — PROTEIN / CREATININE RATIO, URINE
Creatinine, Urine: 92 mg/dL
Protein Creatinine Ratio: 0.82 mg/mg{Cre} — ABNORMAL HIGH (ref 0.00–0.15)
Total Protein, Urine: 75 mg/dL

## 2017-07-08 LAB — POCT FERN TEST: POCT Fern Test: POSITIVE

## 2017-07-08 LAB — POCT PREGNANCY, URINE: Preg Test, Ur: POSITIVE — AB

## 2017-07-08 LAB — POSTPARTUM HEMORRHAGE PROTOCOL (BB NOTIFICATION)

## 2017-07-08 LAB — FIBRINOGEN: Fibrinogen: 348 mg/dL (ref 210–475)

## 2017-07-08 LAB — ABO/RH: ABO/RH(D): O POS

## 2017-07-08 LAB — APTT: APTT: 30 s (ref 24–36)

## 2017-07-08 SURGERY — DILATION AND CURETTAGE
Anesthesia: Epidural

## 2017-07-08 MED ORDER — PHENYLEPHRINE 40 MCG/ML (10ML) SYRINGE FOR IV PUSH (FOR BLOOD PRESSURE SUPPORT)
80.0000 ug | PREFILLED_SYRINGE | INTRAVENOUS | Status: DC | PRN
  Administered 2017-07-08: 80 ug via INTRAVENOUS
  Filled 2017-07-08: qty 5

## 2017-07-08 MED ORDER — LACTATED RINGERS IV SOLN
INTRAVENOUS | Status: DC
  Administered 2017-07-08: 04:00:00 via INTRAVENOUS

## 2017-07-08 MED ORDER — OXYTOCIN 40 UNITS IN LACTATED RINGERS INFUSION - SIMPLE MED
1.0000 m[IU]/min | INTRAVENOUS | Status: DC
  Administered 2017-07-08: 2 m[IU]/min via INTRAVENOUS
  Filled 2017-07-08: qty 1000

## 2017-07-08 MED ORDER — CARBOPROST TROMETHAMINE 250 MCG/ML IM SOLN: 250.0000 ug | INTRAMUSCULAR | Status: DC | PRN

## 2017-07-08 MED ORDER — SODIUM CHLORIDE 0.9% IV SOLUTION: Freq: Once | INTRAVENOUS | Status: DC

## 2017-07-08 MED ORDER — MISOPROSTOL 200 MCG PO TABS
400.0000 ug | ORAL_TABLET | Freq: Once | ORAL | Status: AC
  Administered 2017-07-08: 400 ug via BUCCAL
  Filled 2017-07-08: qty 2

## 2017-07-08 MED ORDER — PHENYLEPHRINE 40 MCG/ML (10ML) SYRINGE FOR IV PUSH (FOR BLOOD PRESSURE SUPPORT)
120.0000 ug | PREFILLED_SYRINGE | Freq: Once | INTRAVENOUS | Status: AC
  Administered 2017-07-08: 120 ug via INTRAVENOUS

## 2017-07-08 MED ORDER — CARBOPROST TROMETHAMINE 250 MCG/ML IM SOLN
INTRAMUSCULAR | Status: AC
  Administered 2017-07-08: 250 ug
  Filled 2017-07-08: qty 1

## 2017-07-08 MED ORDER — OXYTOCIN 40 UNITS IN LACTATED RINGERS INFUSION - SIMPLE MED: 2.5000 [IU]/h | INTRAVENOUS | Status: DC

## 2017-07-08 MED ORDER — EPHEDRINE 5 MG/ML INJ
10.0000 mg | INTRAVENOUS | Status: DC | PRN
  Filled 2017-07-08: qty 2

## 2017-07-08 MED ORDER — OXYTOCIN BOLUS FROM INFUSION
500.0000 mL | Freq: Once | INTRAVENOUS | Status: AC
  Administered 2017-07-08: 500 mL via INTRAVENOUS

## 2017-07-08 MED ORDER — DIPHENHYDRAMINE HCL 50 MG/ML IJ SOLN: 12.5000 mg | INTRAMUSCULAR | Status: DC | PRN

## 2017-07-08 MED ORDER — OXYCODONE HCL 5 MG PO TABS: 5.0000 mg | ORAL_TABLET | ORAL | Status: DC | PRN

## 2017-07-08 MED ORDER — COCONUT OIL OIL: 1.0000 "application " | TOPICAL_OIL | Status: DC | PRN

## 2017-07-08 MED ORDER — BENZOCAINE-MENTHOL 20-0.5 % EX AERO
1.0000 "application " | INHALATION_SPRAY | CUTANEOUS | Status: DC | PRN
  Filled 2017-07-08: qty 56

## 2017-07-08 MED ORDER — ACETAMINOPHEN 325 MG PO TABS: 650.0000 mg | ORAL_TABLET | ORAL | Status: DC | PRN

## 2017-07-08 MED ORDER — PRENATAL MULTIVITAMIN CH
1.0000 | ORAL_TABLET | Freq: Every day | ORAL | Status: DC
  Administered 2017-07-09 – 2017-07-10 (×2): 1 via ORAL
  Filled 2017-07-08 (×2): qty 1

## 2017-07-08 MED ORDER — DIPHENHYDRAMINE HCL 25 MG PO CAPS: 25.0000 mg | ORAL_CAPSULE | Freq: Four times a day (QID) | ORAL | Status: DC | PRN

## 2017-07-08 MED ORDER — OXYCODONE-ACETAMINOPHEN 5-325 MG PO TABS: 2.0000 | ORAL_TABLET | ORAL | Status: DC | PRN

## 2017-07-08 MED ORDER — ONDANSETRON HCL 4 MG/2ML IJ SOLN: 4.0000 mg | INTRAMUSCULAR | Status: DC | PRN

## 2017-07-08 MED ORDER — MISOPROSTOL 200 MCG PO TABS
ORAL_TABLET | ORAL | Status: AC
  Administered 2017-07-08: 13:00:00 via BUCCAL
  Filled 2017-07-08: qty 2

## 2017-07-08 MED ORDER — ONDANSETRON HCL 4 MG PO TABS: 4.0000 mg | ORAL_TABLET | ORAL | Status: DC | PRN

## 2017-07-08 MED ORDER — LACTATED RINGERS IV BOLUS: 500.0000 mL | Freq: Once | INTRAVENOUS | Status: DC

## 2017-07-08 MED ORDER — OXYCODONE HCL 5 MG PO TABS
10.0000 mg | ORAL_TABLET | ORAL | Status: DC | PRN
  Administered 2017-07-08 – 2017-07-09 (×2): 10 mg via ORAL
  Filled 2017-07-08 (×2): qty 2

## 2017-07-08 MED ORDER — DIBUCAINE 1 % RE OINT: 1.0000 "application " | TOPICAL_OINTMENT | RECTAL | Status: DC | PRN

## 2017-07-08 MED ORDER — WITCH HAZEL-GLYCERIN EX PADS: 1.0000 "application " | MEDICATED_PAD | CUTANEOUS | Status: DC | PRN

## 2017-07-08 MED ORDER — MISOPROSTOL 200 MCG PO TABS
ORAL_TABLET | ORAL | Status: AC
  Administered 2017-07-08: 200 ug via BUCCAL
  Filled 2017-07-08: qty 3

## 2017-07-08 MED ORDER — IBUPROFEN 600 MG PO TABS
600.0000 mg | ORAL_TABLET | Freq: Four times a day (QID) | ORAL | Status: DC
  Administered 2017-07-08 – 2017-07-10 (×8): 600 mg via ORAL
  Filled 2017-07-08 (×8): qty 1

## 2017-07-08 MED ORDER — ONDANSETRON HCL 4 MG/2ML IJ SOLN
4.0000 mg | Freq: Four times a day (QID) | INTRAMUSCULAR | Status: DC | PRN
  Administered 2017-07-08: 4 mg via INTRAVENOUS
  Filled 2017-07-08: qty 2

## 2017-07-08 MED ORDER — CARBOPROST TROMETHAMINE 250 MCG/ML IM SOLN: 250.0000 ug | Freq: Once | INTRAMUSCULAR | Status: DC

## 2017-07-08 MED ORDER — SIMETHICONE 80 MG PO CHEW: 80.0000 mg | CHEWABLE_TABLET | ORAL | Status: DC | PRN

## 2017-07-08 MED ORDER — TETANUS-DIPHTH-ACELL PERTUSSIS 5-2.5-18.5 LF-MCG/0.5 IM SUSP: 0.5000 mL | Freq: Once | INTRAMUSCULAR | Status: DC

## 2017-07-08 MED ORDER — FLEET ENEMA 7-19 GM/118ML RE ENEM: 1.0000 | ENEMA | Freq: Every day | RECTAL | Status: DC | PRN

## 2017-07-08 MED ORDER — PHENYLEPHRINE 40 MCG/ML (10ML) SYRINGE FOR IV PUSH (FOR BLOOD PRESSURE SUPPORT)
80.0000 ug | PREFILLED_SYRINGE | INTRAVENOUS | Status: DC | PRN
  Administered 2017-07-08: 120 ug via INTRAVENOUS
  Filled 2017-07-08: qty 10
  Filled 2017-07-08: qty 5

## 2017-07-08 MED ORDER — SOD CITRATE-CITRIC ACID 500-334 MG/5ML PO SOLN: 30.0000 mL | ORAL | Status: DC | PRN

## 2017-07-08 MED ORDER — MISOPROSTOL 200 MCG PO TABS: 800.0000 ug | ORAL_TABLET | Freq: Once | ORAL | Status: DC

## 2017-07-08 MED ORDER — LACTATED RINGERS IV SOLN: 500.0000 mL | Freq: Once | INTRAVENOUS | Status: DC

## 2017-07-08 MED ORDER — OXYCODONE-ACETAMINOPHEN 5-325 MG PO TABS: 1.0000 | ORAL_TABLET | ORAL | Status: DC | PRN

## 2017-07-08 MED ORDER — TERBUTALINE SULFATE 1 MG/ML IJ SOLN: 0.2500 mg | Freq: Once | INTRAMUSCULAR | Status: DC | PRN

## 2017-07-08 MED ORDER — SENNOSIDES-DOCUSATE SODIUM 8.6-50 MG PO TABS
2.0000 | ORAL_TABLET | ORAL | Status: DC
  Administered 2017-07-08 – 2017-07-09 (×2): 2 via ORAL
  Filled 2017-07-08 (×2): qty 2

## 2017-07-08 MED ORDER — FENTANYL 2.5 MCG/ML BUPIVACAINE 1/10 % EPIDURAL INFUSION (WH - ANES)
14.0000 mL/h | INTRAMUSCULAR | Status: DC | PRN
  Administered 2017-07-08: 14 mL/h via EPIDURAL
  Filled 2017-07-08: qty 100

## 2017-07-08 MED ORDER — LACTATED RINGERS IV SOLN
500.0000 mL | INTRAVENOUS | Status: DC | PRN
  Administered 2017-07-08: 1000 mL via INTRAVENOUS

## 2017-07-08 MED ORDER — LIDOCAINE HCL (PF) 1 % IJ SOLN
INTRAMUSCULAR | Status: DC | PRN
  Administered 2017-07-08 (×2): 5 mL via EPIDURAL

## 2017-07-08 MED ORDER — LIDOCAINE HCL (PF) 1 % IJ SOLN
30.0000 mL | INTRAMUSCULAR | Status: DC | PRN
  Filled 2017-07-08: qty 30

## 2017-07-08 MED ORDER — MEASLES, MUMPS & RUBELLA VAC ~~LOC~~ INJ
0.5000 mL | INJECTION | Freq: Once | SUBCUTANEOUS | Status: DC
  Filled 2017-07-08: qty 0.5

## 2017-07-08 NOTE — Anesthesia Pain Management Evaluation Note (Signed)
  CRNA Pain Management Visit Note  Patient: [name], 37 y.o., female  "Hello I am a member of the anesthesia team at [facility]. We have an anesthesia team available at all times to provide care throughout the hospital, including epidural management and anesthesia for C-section. I don't know your plan for the delivery whether it a natural birth, water birth, IV sedation, nitrous supplementation, doula or epidural, but we want to meet your pain goals."   1.Was your pain managed to your expectations on prior hospitalizations?   Yes   2.What is your expectation for pain management during this hospitalization?     Epidural  3.How can we help you reach that goal?   Record the patient's initial score and the patient's pain goal.   Pain: 0  Pain Goal: 8 [facility] wants you to be able to say your pain was always managed very well.  Laban EmperorMalinova,Michelle [name] 07/08/2017

## 2017-07-08 NOTE — Anesthesia Preprocedure Evaluation (Signed)
Anesthesia Evaluation  Patient identified by MRN, date of birth, ID band Patient awake    Reviewed: Allergy & Precautions, H&P , NPO status , Patient's Chart, lab work & pertinent test results  History of Anesthesia Complications Negative for: history of anesthetic complications  Airway Mallampati: II  TM Distance: >3 FB Neck ROM: full    Dental no notable dental hx. (+) Teeth Intact   Pulmonary neg pulmonary ROS, Current Smoker,    Pulmonary exam normal breath sounds clear to auscultation       Cardiovascular negative cardio ROS Normal cardiovascular exam Rhythm:regular Rate:Normal     Neuro/Psych negative neurological ROS  negative psych ROS   GI/Hepatic negative GI ROS, Neg liver ROS,   Endo/Other  negative endocrine ROS  Renal/GU negative Renal ROS  negative genitourinary   Musculoskeletal   Abdominal   Peds  Hematology negative hematology ROS (+)   Anesthesia Other Findings   Reproductive/Obstetrics (+) Pregnancy                             Anesthesia Physical Anesthesia Plan  ASA: II  Anesthesia Plan: Epidural   Post-op Pain Management:    Induction:   PONV Risk Score and Plan:   Airway Management Planned:   Additional Equipment:   Intra-op Plan:   Post-operative Plan:   Informed Consent: I have reviewed the patients History and Physical, chart, labs and discussed the procedure including the risks, benefits and alternatives for the proposed anesthesia with the patient or authorized representative who has indicated his/her understanding and acceptance.     Plan Discussed with:   Anesthesia Plan Comments:         Anesthesia Quick Evaluation  

## 2017-07-08 NOTE — Anesthesia Procedure Notes (Signed)
Epidural Patient location during procedure: OB  Staffing Anesthesiologist: Ghassan Coggeshall, MD Performed: anesthesiologist   Preanesthetic Checklist Completed: patient identified, site marked, surgical consent, pre-op evaluation, timeout performed, IV checked, risks and benefits discussed and monitors and equipment checked  Epidural Patient position: sitting Prep: DuraPrep Patient monitoring: heart rate, continuous pulse ox and blood pressure Approach: right paramedian Location: L3-L4 Injection technique: LOR saline  Needle:  Needle type: Tuohy  Needle gauge: 17 G Needle length: 9 cm and 9 Needle insertion depth: 6 cm Catheter type: closed end flexible Catheter size: 20 Guage Catheter at skin depth: 10 cm Test dose: negative  Assessment Events: blood not aspirated, injection not painful, no injection resistance, negative IV test and no paresthesia  Additional Notes Patient identified. Risks/Benefits/Options discussed with patient including but not limited to bleeding, infection, nerve damage, paralysis, failed block, incomplete pain control, headache, blood pressure changes, nausea, vomiting, reactions to medication both or allergic, itching and postpartum back pain. Confirmed with bedside nurse the patient's most recent platelet count. Confirmed with patient that they are not currently taking any anticoagulation, have any bleeding history or any family history of bleeding disorders. Patient expressed understanding and wished to proceed. All questions were answered. Sterile technique was used throughout the entire procedure. Please see nursing notes for vital signs. Test dose was given through epidural needle and negative prior to continuing to dose epidural or start infusion. Warning signs of high block given to the patient including shortness of breath, tingling/numbness in hands, complete motor block, or any concerning symptoms with instructions to call for help. Patient was given  instructions on fall risk and not to get out of bed. All questions and concerns addressed with instructions to call with any issues.     

## 2017-07-08 NOTE — H&P (Signed)
LABOR AND DELIVERY ADMISSION HISTORY AND PHYSICAL NOTE  Lerry LinerJessica R Vannote is a 37 y.o. female [id number] with IUP at 6555w0d by early U/S presenting for labor and ROM. She reports ROM around 0130.  She reports positive fetal movement. Denies any other concerns. Also denies headache, blurry vision, visual disturbances, N/V, SOB, chest pain, RUQ or epigastric pain. She has noted LE swelling.  Prenatal History/Complications: [facility]; [facility] Pregnancy complications:  - AMA - Desires permanent sterilization (sgined BLT papers on 6/3)  Past Medical History: Past Medical History:  Diagnosis Date  . Chronic kidney disease   . Depression     Past Surgical History: Past Surgical History:  Procedure Laterality Date  . CHOLECYSTECTOMY      Obstetrical History: OB History    Gravida  4   Para  3   Term  3   Preterm      AB      Living  3     SAB      TAB      Ectopic      Multiple      Live Births  3           Social History: Social History   Socioeconomic History  . Marital status: Single    Spouse name: Not on file  . Number of children: Not on file  . Years of education: Not on file  . Highest education level: Not on file  Occupational History  . Not on file  Social Needs  . Financial resource strain: Not on file  . Food insecurity:    Worry: Not on file    Inability: Not on file  . Transportation needs:    Medical: Not on file    Non-medical: Not on file  Tobacco Use  . Smoking status: Current Every Day Smoker  . Smokeless tobacco: Never Used  Substance and Sexual Activity  . Alcohol use: Not Currently    Frequency: Never  . Drug use: Never  . Sexual activity: Yes    Partners: Male  Lifestyle  . Physical activity:    Days per week: Not on file    Minutes per session: Not on file  . Stress: Not on file  Relationships  . Social connections:    Talks on phone: Not on file    Gets together: Not on file     Attends religious service: Not on file    Active member of club or organization: Not on file    Attends meetings of clubs or organizations: Not on file    Relationship status: Not on file  Other Topics Concern  . Not on file  Social History Narrative  . Not on file    Family History: Family History  Problem Relation Age of Onset  . Diabetes Maternal Grandmother   . Cancer Maternal Grandmother        cervical  . Endometriosis Mother     Allergies: No Known Allergies  Medications Prior to Admission  Medication Sig Dispense Refill Last Dose  . prenatal vitamin w/FE, FA (PRENATAL 1 + 1) 27-1 MG TABS tablet Take 1 tablet by mouth daily at 12 noon.   07/07/2017 at Unknown time     Review of Systems  All systems reviewed and negative except as stated in HPI  Physical Exam Blood pressure (!) 134/93, pulse 72, temperature (!) 97.3 F (36.3 C), temperature source Oral, resp. rate 18, height  5' (1.524 m), weight 168 lb 12.8 oz (76.6 kg), SpO2 98 %. General appearance: alert, oriented Lungs: normal respiratory effort Heart: regular rate Abdomen: soft, non-tender; gravid, FH appropriate for GA Extremities: 1+ BLE edema Presentation: cephalic Fetal monitoring: baseline rate 135, moderate variability, +acel, no decel Uterine activity: ctx difficult to trace Dilation: 4 Effacement (%): 90 Station: -2 Exam by:: Bari Mantis RN  Prenatal labs: ABO, Rh: --/--/O POS, O POS Performed at [facility], [address], [place], [place] [address]  [phone]/28 0320) Antibody: NEG (06/28 0320) Rubella:  immune RPR:   nonreactive HBsAg:   negative HIV:   nonreactive GC/Chlamydia: negative GBS:   negative Glucola: 123, normal Genetic screening:  Normal NIPS Anatomy US: normal  Prenatal Transfer Tool  Maternal Diabetes: No Genetic Screening: Normal Maternal Ultrasounds/Referrals: Normal Fetal Ultrasounds or other Referrals:  None Maternal Substance Abuse:  No Significant Maternal  Medications:  None Significant Maternal Lab Results: Lab values include: Group B Strep negative  Results for orders placed or performed during the hospital encounter of 07/08/17 (from the past 24 hour(s))  POCT fern test   Collection Time: 07/08/17  2:39 AM  Result Value Ref Range   POCT Fern Test Positive = ruptured amniotic membanes   Pregnancy, urine POC   Collection Time: 07/08/17  3:00 AM  Result Value Ref Range   Preg Test, Ur POSITIVE (A) NEGATIVE    Patient Active Problem List   Diagnosis Date Noted  . AMA (advanced maternal age) multigravida 35+ 06/29/2017  . Supervision of other normal pregnancy, antepartum 06/23/2017    Assessment: MAKALA FETTEROLF is a 37 y.o. G4P3003 at [redacted]w[redacted]d here for early labor and SROM@0130 .  #Labor: Expectant management for now. Will augment with Pitocin if needed  #Elevated BPs: she had elevated BP on 6/8, now again today. New dx of GHTN. Check CBC, CMP, UPC. She is asymptomatic   #Pain: Will get epidral #FWB: Cat I #ID:  GBS neg #MOF: breast #MOC: interval BTL (papers signed on 6/3)   Kandra Nicolas Degele 07/08/2017, 3:19 AM

## 2017-07-08 NOTE — Progress Notes (Signed)
Contraction pattern has spaced out FHT Cat I (135, mod variability, +acel, no decel)  --Start IV Pitocin 2 x2  Kandra NicolasJulie P. Wren Pryce, MD OB Fellow

## 2017-07-08 NOTE — MAU Note (Signed)
Pt reports SROM clear around 0130. Denies bleeding. +FM

## 2017-07-08 NOTE — Anesthesia Postprocedure Evaluation (Signed)
Anesthesia Post Note  Patient: Michelle [name]  Procedure(s) Performed: AN AD HOC LABOR EPIDURAL     Patient location during evaluation: Mother Baby Anesthesia Type: Epidural Level of consciousness: awake and alert Pain management: pain level controlled Vital Signs Assessment: post-procedure vital signs reviewed and stable Respiratory status: spontaneous breathing, nonlabored ventilation and respiratory function stable Cardiovascular status: stable Postop Assessment: no headache, no backache and epidural receding Anesthetic complications: no    Last Vitals:  Vitals:   07/08/17 1631 07/08/17 1730  BP: 124/86 130/84  Pulse: 79 77  Resp: 18 16  Temp: 37.3 C 37.1 C  SpO2: 100% 97%    Last Pain:  Vitals:   07/08/17 1730  TempSrc: Oral  PainSc: 3    Pain Goal: Patients Stated Pain Goal: 3 (07/08/17 1730)               Junious SilkGILBERT,Dempsey Knotek

## 2017-07-08 NOTE — Progress Notes (Signed)
Michelle LinerJessica R [name] is a 37 y.o. [phone] 2024G4P3003 at 764w0d  admitted for rupture of membranes  Subjective: Feeling pressure   Objective: BP 130/80   Pulse 73   Temp 98.1 F (36.7 C)   Resp 16   Ht 5' (1.524 m)   Wt 168 lb (76.2 kg)   SpO2 98%   BMI 32.81 kg/m  No intake/output data recorded. No intake/output data recorded.  FHT:  FHR: 140 bpm, variability: moderate,  accelerations:  Present,  decelerations:  Present early UC:   regular, every 2-3 minutes SVE:   Dilation: Lip/rim Effacement (%): 60, 70 Station: -2 Exam by:: m wilkins rnc  Labs: Lab Results  Component Value Date   WBC 10.6 (H) 07/08/2017   HGB 13.4 07/08/2017   HCT 39.1 07/08/2017   MCV 96.3 07/08/2017   PLT 172 07/08/2017    Assessment / Plan: Augmentation of labor, progressing well  Labor: Progressing normally Preeclampsia:  labs stable Fetal Wellbeing:  Category I Pain Control:  Labor support without medications I/D:  n/a Anticipated MOD:  NSVD  Thressa ShellerHeather Asanti Craigo 07/08/2017, 11:57 AM

## 2017-07-09 DIAGNOSIS — O1494 Unspecified pre-eclampsia, complicating childbirth: Secondary | ICD-10-CM | POA: Diagnosis present

## 2017-07-09 DIAGNOSIS — D62 Acute posthemorrhagic anemia: Secondary | ICD-10-CM | POA: Diagnosis not present

## 2017-07-09 LAB — CBC
HCT: 25.1 % — ABNORMAL LOW (ref 36.0–46.0)
HEMATOCRIT: 24.1 % — AB (ref 36.0–46.0)
HEMOGLOBIN: 8.5 g/dL — AB (ref 12.0–15.0)
Hemoglobin: 8.9 g/dL — ABNORMAL LOW (ref 12.0–15.0)
MCH: 34 pg (ref 26.0–34.0)
MCH: 34.3 pg — AB (ref 26.0–34.0)
MCHC: 35.5 g/dL (ref 30.0–36.0)
MCHC: 35.3 g/dL (ref 30.0–36.0)
MCV: 95.8 fL (ref 78.0–100.0)
MCV: 97.2 fL (ref 78.0–100.0)
PLATELETS: 144 10*3/uL — AB (ref 150–400)
Platelets: 158 10*3/uL (ref 150–400)
RBC: 2.62 MIL/uL — AB (ref 3.87–5.11)
RBC: 2.48 MIL/uL — ABNORMAL LOW (ref 3.87–5.11)
RDW: 13.8 % (ref 11.5–15.5)
RDW: 14 % (ref 11.5–15.5)
WBC: 15 10*3/uL — ABNORMAL HIGH (ref 4.0–10.5)
WBC: 13.2 10*3/uL — ABNORMAL HIGH (ref 4.0–10.5)

## 2017-07-09 MED ORDER — ENALAPRIL MALEATE 2.5 MG PO TABS
2.5000 mg | ORAL_TABLET | Freq: Every day | ORAL | Status: DC
  Administered 2017-07-09 – 2017-07-10 (×2): 2.5 mg via ORAL
  Filled 2017-07-09 (×3): qty 1

## 2017-07-09 NOTE — Progress Notes (Signed)
Mother of baby was referred for history of depression and anxiety. Referral screened out by CSW because per chart review, patient has no documented instances where she was experiencing symptoms of depression. No mention of the diagnosis in her prenatal record either.   Please contact CSW if mother of baby requests, if needs arise, or if mother of baby scores greater than a nine or answers yes to question ten on [place] Postpartum Depression Screen.   Edwin Dadaarol Johnothan Bascomb, MSW, LCSW-A Clinical Social Worker [facility] [phone]

## 2017-07-09 NOTE — Progress Notes (Signed)
100 cc water removed from Bakri balloon. Lochia scant; minimal drainage in Bakri drainage bag.

## 2017-07-09 NOTE — Progress Notes (Addendum)
Post Partum Day 1 Subjective: no complaints, tolerating PO and on bedrest Minimal output from Bakri  Objective: Blood pressure (!) 148/83, pulse 84, temperature 98.2 F (36.8 C), temperature source Oral, resp. rate 18, height 5' (1.524 m), weight 168 lb (76.2 kg), SpO2 97 %, unknown if currently breastfeeding.  Physical Exam:  General: alert, cooperative and appears stated age Lochia: appropriate Uterine Fundus: firm, NT DVT Evaluation: No evidence of DVT seen on physical exam.  Recent Labs    07/08/17 1455 07/09/17 0552  HGB 11.1* 8.9*  HCT 31.8* 25.1*    Assessment/Plan: Begin slow removal of Bakri Balloon Increase ambulation and see if pt. Has dizziness due to Acute blood loss anemia due to postpartum hemorrhage Pre-eclampsia without severe features--BP is up--will add Vasotec Plan for discharge tomorrow, Breastfeeding and Contraception BTL   LOS: 1 day   Reva Boresanya S Auriah Hollings 07/09/2017, 7:34 AM

## 2017-07-09 NOTE — Progress Notes (Signed)
100 cc removed from Bakri balloon. Minimal fluid in Bakri collection bag.

## 2017-07-09 NOTE — Progress Notes (Signed)
Bakri balloon only held 280 cc; Bakri discontinued.

## 2017-07-10 MED ORDER — ENALAPRIL MALEATE 2.5 MG PO TABS: 2.5000 mg | ORAL_TABLET | Freq: Every day | ORAL | 0 refills | Status: DC

## 2017-07-10 MED ORDER — IBUPROFEN 600 MG PO TABS: 600.0000 mg | ORAL_TABLET | Freq: Four times a day (QID) | ORAL | 0 refills | Status: DC

## 2017-07-10 MED ORDER — OXYCODONE HCL 5 MG PO TABS: 5.0000 mg | ORAL_TABLET | ORAL | 0 refills | Status: DC | PRN

## 2017-07-10 NOTE — Discharge Summary (Signed)
OB Discharge Summary     Patient Name: Michelle [name] DOB: 04-Jul-1980 MRN: [id number]  Date of admission: 07/08/2017 Delivering MD: Thressa Sheller D   Date of discharge: 07/10/2017  Admitting diagnosis: 39 WEEKS ROM Intrauterine pregnancy: [redacted]w[redacted]d     Secondary diagnosis:  Active Problems:   AMA (advanced maternal age) multigravida 35+   Normal labor   Postpartum hemorrhage   Acute blood loss anemia   Pre-eclampsia affecting childbirth  Additional problems:      Discharge diagnosis: Term Pregnancy Delivered, SPEC, Postpartum Hemorrhage                                                                                             Post partum procedures:Bakri Ballon  Augmentation: Pitocin  Complications: Hemorrhage>1072mL  Hospital course:  Pt was admitted with DX of SPEC. IOL secondary to [facility]. Received magnesium. TSVD complicated by postpartum hemorrhage. Tx with medical intervention plus Bakri balloon placement with good response. Barki balloon slowly decreased and removed after 24 hrs and no further bleeding. Magnesium x 24 hrs postpartum. Started in [place] with good response. Progressed to ambulating, voiding, tolerating diet, + flatus and good oral pain control. Felt amendable for discharge home on PPD # 2  Discharge instructions and medications reviewed with pt. Pt verbalized understanding   Physical exam  Vitals:   07/09/17 2322 07/10/17 0449 07/10/17 0903 07/10/17 1127  BP:  (!) 146/87 139/88 (!) 136/95  Pulse: 82 81 73 95  Resp: 16 16 18 18   Temp: 98.4 F (36.9 C) 98 F (36.7 C) 97.9 F (36.6 C) 99 F (37.2 C)  TempSrc: Oral  Oral Oral  SpO2: 97% 99% 99% 98%  Weight:      Height:       General: alert Lochia: appropriate Uterine Fundus: firm Incision: N/A DVT Evaluation: No evidence of DVT seen on physical exam. Labs: Lab Results  Component Value Date   WBC 13.2 (H) 07/09/2017   HGB 8.5 (L) 07/09/2017   HCT 24.1 (L) 07/09/2017   MCV 97.2  07/09/2017   PLT 158 07/09/2017   CMP Latest Ref Rng & Units 07/08/2017  Glucose 70 - 99 mg/dL 75  BUN 6 - 20 mg/dL 10  Creatinine 4.09 - 8.11 mg/dL 9.14  Sodium 782 - 956 mmol/L 133(L)  Potassium 3.5 - 5.1 mmol/L 3.5  Chloride 98 - 111 mmol/L 104  CO2 22 - 32 mmol/L 18(L)  Calcium 8.9 - 10.3 mg/dL 8.2(L)  Total Protein 6.5 - 8.1 g/dL 2.1(H)  Total Bilirubin 0.3 - 1.2 mg/dL 0.7  Alkaline Phos 38 - 126 U/L 194(H)  AST 15 - 41 U/L 25  ALT 0 - 44 U/L 14    Discharge instruction: per After Visit Summary and "Baby and Me Booklet".  After visit meds:  Allergies as of 07/10/2017   No Known Allergies     Medication List    TAKE these medications   enalapril 2.5 MG tablet Commonly known as:  VASOTEC Take 1 tablet (2.5 mg total) by mouth daily. Start taking on:  07/11/2017   ibuprofen 600 MG tablet Commonly known as:  ADVIL,MOTRIN Take 1 tablet (600 mg total) by mouth every 6 (six) hours.   oxyCODONE 5 MG immediate release tablet Commonly known as:  Oxy IR/ROXICODONE Take 1 tablet (5 mg total) by mouth every 4 (four) hours as needed (pain scale 4-7).   prenatal vitamin w/FE, FA 27-1 MG Tabs tablet Take 1 tablet by mouth daily at 12 noon.       Diet: routine diet  Activity: Advance as tolerated. Pelvic rest for 6 weeks.   Outpatient follow up: 1 week for BP check Follow up Appt:No future appointments. Follow up Visit:No follow-ups on file.  Postpartum contraception: Abstinence  Newborn Data: Live born female  Birth Weight: 6 lb 9.3 oz (2985 g) APGAR: 8, 9  Newborn Delivery   Birth date/time:  07/08/2017 12:08:00 Delivery type:  Vaginal, Spontaneous     Baby Feeding: Breast Disposition:home with mother   07/10/2017 Michelle StaggersMichael L Javonn Gauger, MD

## 2017-07-10 NOTE — Discharge Instructions (Signed)
Postpartum Care After Vaginal Delivery °The period of time right after you deliver your newborn is called the postpartum period. °What kind of medical care will I receive? °· You may continue to receive fluids and medicines through an IV tube inserted into one of your veins. °· If an incision was made near your vagina (episiotomy) or if you had some vaginal tearing during delivery, cold compresses may be placed on your episiotomy or your tear. This helps to reduce pain and swelling. °· You may be given a squirt bottle to use when you go to the bathroom. You may use this until you are comfortable wiping as usual. To use the squirt bottle, follow these steps: °? Before you urinate, fill the squirt bottle with warm water. Do not use hot water. °? After you urinate, while you are sitting on the toilet, use the squirt bottle to rinse the area around your urethra and vaginal opening. This rinses away any urine and blood. °? You may do this instead of wiping. As you start healing, you may use the squirt bottle before wiping yourself. Make sure to wipe gently. °? Fill the squirt bottle with clean water every time you use the bathroom. °· You will be given sanitary pads to wear. °How can I expect to feel? °· You may not feel the need to urinate for several hours after delivery. °· You will have some soreness and pain in your abdomen and vagina. °· If you are breastfeeding, you may have uterine contractions every time you breastfeed for up to several weeks postpartum. Uterine contractions help your uterus return to its normal size. °· It is normal to have vaginal bleeding (lochia) after delivery. The amount and appearance of lochia is often similar to a menstrual period in the first week after delivery. It will gradually decrease over the next few weeks to a dry, yellow-brown discharge. For most women, lochia stops completely by 6-8 weeks after delivery. Vaginal bleeding can vary from woman to woman. °· Within the first few  days after delivery, you may have breast engorgement. This is when your breasts feel heavy, full, and uncomfortable. Your breasts may also throb and feel hard, tightly stretched, warm, and tender. After this occurs, you may have milk leaking from your breasts. Your health care provider can help you relieve discomfort due to breast engorgement. Breast engorgement should go away within a few days. °· You may feel more sad or worried than normal due to hormonal changes after delivery. These feelings should not last more than a few days. If these feelings do not go away after several days, speak with your health care provider. °How should I care for myself? °· Tell your health care provider if you have pain or discomfort. °· Drink enough water to keep your urine clear or pale yellow. °· Wash your hands thoroughly with soap and water for at least 20 seconds after changing your sanitary pads, after using the toilet, and before holding or feeding your baby. °· If you are not breastfeeding, avoid touching your breasts a lot. Doing this can make your breasts produce more milk. °· If you become weak or lightheaded, or you feel like you might faint, ask for help before: °? Getting out of bed. °? Showering. °· Change your sanitary pads frequently. Watch for any changes in your flow, such as a sudden increase in volume, a change in color, the passing of large blood clots. If you pass a blood clot from your vagina, save it   to show to your health care provider. Do not flush blood clots down the toilet without having your health care provider look at them. °· Make sure that all your vaccinations are up to date. This can help protect you and your baby from getting certain diseases. You may need to have immunizations done before you leave the hospital. °· If desired, talk with your health care provider about methods of family planning or birth control (contraception). °How can I start bonding with my baby? °Spending as much time as  possible with your baby is very important. During this time, you and your baby can get to know each other and develop a bond. Having your baby stay with you in your room (rooming in) can give you time to get to know your baby. Rooming in can also help you become comfortable caring for your baby. Breastfeeding can also help you bond with your baby. °How can I plan for returning home with my baby? °· Make sure that you have a car seat installed in your vehicle. °? Your car seat should be checked by a certified car seat installer to make sure that it is installed safely. °? Make sure that your baby fits into the car seat safely. °· Ask your health care provider any questions you have about caring for yourself or your baby. Make sure that you are able to contact your health care provider with any questions after leaving the hospital. °This information is not intended to replace advice given to you by your health care provider. Make sure you discuss any questions you have with your health care provider. °Document Released: 10/25/2006 Document Revised: 06/02/2015 Document Reviewed: 12/02/2014 °Elsevier Interactive Patient Education © 2018 [employer] ° °

## 2017-07-11 LAB — BPAM RBC
BLOOD PRODUCT EXPIRATION DATE: 201907272359
Blood Product Expiration Date: 201907262359
ISSUE DATE / TIME: 201906281249
ISSUE DATE / TIME: 201906281249
UNIT TYPE AND RH: 5100
Unit Type and Rh: 5100

## 2017-07-11 LAB — TYPE AND SCREEN
ABO/RH(D): O POS
ANTIBODY SCREEN: NEGATIVE
UNIT DIVISION: 0
Unit division: 0

## 2017-07-12 ENCOUNTER — Ambulatory Visit (INDEPENDENT_AMBULATORY_CARE_PROVIDER_SITE_OTHER): Payer: Medicaid Other

## 2017-07-12 VITALS — BP 130/89 | HR 88

## 2017-07-12 DIAGNOSIS — Z0130 Encounter for examination of blood pressure without abnormal findings: Secondary | ICD-10-CM

## 2017-07-12 NOTE — Progress Notes (Signed)
Pt instructed by Scheryl DarterJames Arnold, MD to be seen in office for a BP check 1 week after delivery. Pt was seen today for a BP check after giving birth on 07/08/17. BP reading 130/89.   Discussed with pt that if she starts to experience visual changes and headaches to give our office a call. Pt states that she has BP monitor at home and will continue to check BP. Pt scheduled postpartum visit when she came in today.

## 2017-07-25 ENCOUNTER — Telehealth: Payer: Self-pay | Admitting: *Deleted

## 2017-07-25 NOTE — Telephone Encounter (Signed)
Patient would like to know when her tubal will be done. Patient stated that she has signed consent. Patient thought that it would be done in the office during Postpartum visit on 08/05/17 but I informed her that it is a surgery.

## 2017-08-04 ENCOUNTER — Encounter: Payer: Self-pay | Admitting: Obstetrics & Gynecology

## 2017-08-04 ENCOUNTER — Encounter (HOSPITAL_COMMUNITY): Payer: Self-pay

## 2017-08-04 ENCOUNTER — Ambulatory Visit (INDEPENDENT_AMBULATORY_CARE_PROVIDER_SITE_OTHER): Payer: Medicaid Other | Admitting: Obstetrics & Gynecology

## 2017-08-04 MED ORDER — MEDROXYPROGESTERONE ACETATE 150 MG/ML IM SUSP: 150.0000 mg | INTRAMUSCULAR | 0 refills | Status: DC

## 2017-08-04 NOTE — Progress Notes (Signed)
Post Partum Exam  Michelle LinerJessica R [name] is a 37 y.o. 554P4004 female who presents for a postpartum visit. She is 4 weeks postpartum following a spontaneous vaginal delivery. I have fully reviewed the prenatal and intrapartum course. The delivery was at 39 gestational weeks.  Anesthesia: epidural. Postpartum course has been unremarkable. Baby's course has been unremarkable. Baby is feeding by bottle using Gerber Gentle Formula. Bleeding which is dark brown. Bowel function is normal. Bladder function is normal. Patient is sexually active. Contraception method  Is pt desires BTS and consent was signed 06/13/17 Postpartum depression screening:neg  The following portions of the patient's history were reviewed and updated as appropriate: allergies, current medications, past family history, past medical history, past social history, past surgical history and problem list. Last pap smear done 1/19 and was Normal  Review of Systems Pertinent items are noted in HPI.    Objective:  unknown if currently breastfeeding.  General:  alert   Breasts:  inspection negative, no nipple discharge or bleeding, no masses or nodularity palpable  Lungs: clear to auscultation bilaterally  Heart:  regular rate and rhythm, S1, S2 normal, no murmur, click, rub or gallop  Abdomen: soft, non-tender; bowel sounds normal; no masses,  no organomegaly   Vulva:  normal  Vagina: not evaluated  Cervix:  Not evaluated  Corpus: not examined  Adnexa:  not evaluated  Rectal Exam: Not performed.        Assessment:   Normal postpartum exam. Pap smear not done at today's visit.   Plan:   1. Contraception: tubal ligation to be scheduled. I sent Rhea BleacherJacinda a message about this.  2. I have ordered depo provera. She will pick it up and get her shot in the near future.

## 2017-08-05 ENCOUNTER — Ambulatory Visit (INDEPENDENT_AMBULATORY_CARE_PROVIDER_SITE_OTHER): Payer: Medicaid Other | Admitting: *Deleted

## 2017-08-05 ENCOUNTER — Ambulatory Visit: Payer: Medicaid Other | Admitting: Advanced Practice Midwife

## 2017-08-05 DIAGNOSIS — Z3042 Encounter for surveillance of injectable contraceptive: Secondary | ICD-10-CM | POA: Diagnosis not present

## 2017-08-05 DIAGNOSIS — Z3202 Encounter for pregnancy test, result negative: Secondary | ICD-10-CM

## 2017-08-05 DIAGNOSIS — Z3009 Encounter for other general counseling and advice on contraception: Secondary | ICD-10-CM

## 2017-08-05 LAB — POCT URINE PREGNANCY: PREG TEST UR: NEGATIVE

## 2017-08-05 MED ORDER — MEDROXYPROGESTERONE ACETATE 150 MG/ML IM SUSP
150.0000 mg | Freq: Once | INTRAMUSCULAR | Status: AC
  Administered 2017-08-05: 150 mg via INTRAMUSCULAR

## 2017-08-05 NOTE — Progress Notes (Signed)
Pt here for Depo Provera 150 mg injection.  She is going to be scheduled for BTS but [facility] .  This is per VO Dr Marice Potterove.

## 2017-09-05 ENCOUNTER — Other Ambulatory Visit: Payer: Self-pay

## 2017-09-05 ENCOUNTER — Encounter (HOSPITAL_BASED_OUTPATIENT_CLINIC_OR_DEPARTMENT_OTHER): Payer: Self-pay | Admitting: *Deleted

## 2017-09-05 NOTE — Pre-Procedure Instructions (Signed)
To come for BMET and urine preg.

## 2017-09-09 ENCOUNTER — Encounter (HOSPITAL_BASED_OUTPATIENT_CLINIC_OR_DEPARTMENT_OTHER)
Admission: RE | Admit: 2017-09-09 | Discharge: 2017-09-09 | Disposition: A | Discharge status: Home / Self Care | Payer: Medicaid Other | Source: Ambulatory Visit | Attending: Obstetrics & Gynecology | Admitting: Obstetrics & Gynecology

## 2017-09-09 DIAGNOSIS — Z01812 Encounter for preprocedural laboratory examination: Secondary | ICD-10-CM | POA: Diagnosis present

## 2017-09-09 LAB — BASIC METABOLIC PANEL
Anion gap: 8 (ref 5–15)
BUN: 7 mg/dL (ref 6–20)
CHLORIDE: 106 mmol/L (ref 98–111)
CO2: 27 mmol/L (ref 22–32)
Calcium: 9.1 mg/dL (ref 8.9–10.3)
Creatinine, Ser: 0.86 mg/dL (ref 0.44–1.00)
GFR calc non Af Amer: >60 mL/min (ref >60)
Glucose, Bld: 69 mg/dL — ABNORMAL LOW (ref 70–99)
POTASSIUM: 4.5 mmol/L (ref 3.5–5.1)
SODIUM: 141 mmol/L (ref 135–145)

## 2017-09-09 LAB — POCT PREGNANCY, URINE: PREG TEST UR: NEGATIVE

## 2017-09-13 ENCOUNTER — Ambulatory Visit (HOSPITAL_BASED_OUTPATIENT_CLINIC_OR_DEPARTMENT_OTHER): Payer: Medicaid Other | Admitting: Certified Registered"

## 2017-09-13 ENCOUNTER — Other Ambulatory Visit: Payer: Self-pay

## 2017-09-13 ENCOUNTER — Encounter (HOSPITAL_COMMUNITY): Payer: Self-pay

## 2017-09-13 ENCOUNTER — Ambulatory Visit (HOSPITAL_BASED_OUTPATIENT_CLINIC_OR_DEPARTMENT_OTHER)
Admission: RE | Admit: 2017-09-13 | Discharge: 2017-09-13 | Disposition: A | Discharge status: Home / Self Care | Payer: Medicaid Other | Source: Ambulatory Visit | Attending: Obstetrics & Gynecology | Admitting: Obstetrics & Gynecology

## 2017-09-13 ENCOUNTER — Encounter (HOSPITAL_BASED_OUTPATIENT_CLINIC_OR_DEPARTMENT_OTHER): Payer: Self-pay

## 2017-09-13 ENCOUNTER — Encounter (HOSPITAL_BASED_OUTPATIENT_CLINIC_OR_DEPARTMENT_OTHER)
Admission: RE | Disposition: A | Discharge status: Home / Self Care | Payer: Self-pay | Source: Ambulatory Visit | Attending: Obstetrics & Gynecology

## 2017-09-13 DIAGNOSIS — F17210 Nicotine dependence, cigarettes, uncomplicated: Secondary | ICD-10-CM | POA: Insufficient documentation

## 2017-09-13 DIAGNOSIS — F329 Major depressive disorder, single episode, unspecified: Secondary | ICD-10-CM | POA: Insufficient documentation

## 2017-09-13 DIAGNOSIS — Q613 Polycystic kidney, unspecified: Secondary | ICD-10-CM | POA: Insufficient documentation

## 2017-09-13 DIAGNOSIS — Z302 Encounter for sterilization: Secondary | ICD-10-CM | POA: Insufficient documentation

## 2017-09-13 HISTORY — DX: Polycystic kidney, unspecified: Q61.3

## 2017-09-13 HISTORY — PX: LAPAROSCOPIC TUBAL LIGATION: SHX1937

## 2017-09-13 HISTORY — DX: Personal history of other complications of pregnancy, childbirth and the puerperium: Z87.59

## 2017-09-13 HISTORY — DX: Presence of dental prosthetic device (complete) (partial): Z97.2

## 2017-09-13 SURGERY — LIGATION, FALLOPIAN TUBE, LAPAROSCOPIC
Anesthesia: General | Site: Abdomen | Laterality: Bilateral

## 2017-09-13 MED ORDER — ONDANSETRON HCL 4 MG/2ML IJ SOLN
INTRAMUSCULAR | Status: AC
  Filled 2017-09-13: qty 2

## 2017-09-13 MED ORDER — OXYCODONE HCL 5 MG PO TABS
5.0000 mg | ORAL_TABLET | Freq: Once | ORAL | Status: AC | PRN
  Administered 2017-09-13: 5 mg via ORAL

## 2017-09-13 MED ORDER — MIDAZOLAM HCL 2 MG/2ML IJ SOLN
INTRAMUSCULAR | Status: DC | PRN
  Administered 2017-09-13: 2 mg via INTRAVENOUS

## 2017-09-13 MED ORDER — LIDOCAINE 2% (20 MG/ML) 5 ML SYRINGE
INTRAMUSCULAR | Status: DC | PRN
  Administered 2017-09-13: 60 mg via INTRAVENOUS

## 2017-09-13 MED ORDER — DEXAMETHASONE SODIUM PHOSPHATE 10 MG/ML IJ SOLN
INTRAMUSCULAR | Status: AC
  Filled 2017-09-13: qty 1

## 2017-09-13 MED ORDER — FENTANYL CITRATE (PF) 100 MCG/2ML IJ SOLN
INTRAMUSCULAR | Status: AC
  Filled 2017-09-13: qty 2

## 2017-09-13 MED ORDER — PROPOFOL 10 MG/ML IV BOLUS
INTRAVENOUS | Status: AC
  Filled 2017-09-13: qty 20

## 2017-09-13 MED ORDER — PROPOFOL 10 MG/ML IV BOLUS
INTRAVENOUS | Status: DC | PRN
  Administered 2017-09-13: 140 mg via INTRAVENOUS

## 2017-09-13 MED ORDER — FENTANYL CITRATE (PF) 100 MCG/2ML IJ SOLN
INTRAMUSCULAR | Status: DC | PRN
  Administered 2017-09-13: 100 ug via INTRAVENOUS

## 2017-09-13 MED ORDER — CEFAZOLIN SODIUM-DEXTROSE 2-3 GM-%(50ML) IV SOLR
INTRAVENOUS | Status: DC | PRN
  Administered 2017-09-13: 2 g via INTRAVENOUS

## 2017-09-13 MED ORDER — ROCURONIUM BROMIDE 10 MG/ML (PF) SYRINGE
PREFILLED_SYRINGE | INTRAVENOUS | Status: DC | PRN
  Administered 2017-09-13: 50 mg via INTRAVENOUS

## 2017-09-13 MED ORDER — SCOPOLAMINE 1 MG/3DAYS TD PT72: 1.0000 | MEDICATED_PATCH | Freq: Once | TRANSDERMAL | Status: DC | PRN

## 2017-09-13 MED ORDER — FENTANYL CITRATE (PF) 100 MCG/2ML IJ SOLN: 50.0000 ug | INTRAMUSCULAR | Status: DC | PRN

## 2017-09-13 MED ORDER — ROCURONIUM BROMIDE 50 MG/5ML IV SOSY
PREFILLED_SYRINGE | INTRAVENOUS | Status: AC
  Filled 2017-09-13: qty 5

## 2017-09-13 MED ORDER — MIDAZOLAM HCL 2 MG/2ML IJ SOLN
INTRAMUSCULAR | Status: AC
  Filled 2017-09-13: qty 2

## 2017-09-13 MED ORDER — GLYCOPYRROLATE 0.2 MG/ML IJ SOLN
INTRAMUSCULAR | Status: DC | PRN
  Administered 2017-09-13: 0.2 mg via INTRAVENOUS

## 2017-09-13 MED ORDER — OXYCODONE HCL 5 MG PO TABS
ORAL_TABLET | ORAL | Status: AC
  Filled 2017-09-13: qty 1

## 2017-09-13 MED ORDER — IBUPROFEN 600 MG PO TABS: 600.0000 mg | ORAL_TABLET | Freq: Four times a day (QID) | ORAL | 1 refills | Status: DC | PRN

## 2017-09-13 MED ORDER — LIDOCAINE 2% (20 MG/ML) 5 ML SYRINGE
INTRAMUSCULAR | Status: AC
  Filled 2017-09-13: qty 5

## 2017-09-13 MED ORDER — EPHEDRINE SULFATE 50 MG/ML IJ SOLN
INTRAMUSCULAR | Status: DC | PRN
  Administered 2017-09-13: 15 mg via INTRAVENOUS

## 2017-09-13 MED ORDER — OXYCODONE-ACETAMINOPHEN 5-325 MG PO TABS: 1.0000 | ORAL_TABLET | Freq: Four times a day (QID) | ORAL | 0 refills | Status: DC | PRN

## 2017-09-13 MED ORDER — LACTATED RINGERS IV SOLN
INTRAVENOUS | Status: DC
  Administered 2017-09-13: 11:00:00 via INTRAVENOUS

## 2017-09-13 MED ORDER — KETOROLAC TROMETHAMINE 30 MG/ML IJ SOLN
INTRAMUSCULAR | Status: AC
  Filled 2017-09-13: qty 1

## 2017-09-13 MED ORDER — LACTATED RINGERS IV SOLN
INTRAVENOUS | Status: DC | PRN
  Administered 2017-09-13 (×2): via INTRAVENOUS

## 2017-09-13 MED ORDER — SUGAMMADEX SODIUM 500 MG/5ML IV SOLN
INTRAVENOUS | Status: DC | PRN
  Administered 2017-09-13: 300 mg via INTRAVENOUS

## 2017-09-13 MED ORDER — FENTANYL CITRATE (PF) 100 MCG/2ML IJ SOLN
25.0000 ug | INTRAMUSCULAR | Status: DC | PRN
  Administered 2017-09-13: 25 ug via INTRAVENOUS
  Administered 2017-09-13: 50 ug via INTRAVENOUS
  Administered 2017-09-13 (×3): 25 ug via INTRAVENOUS

## 2017-09-13 MED ORDER — MIDAZOLAM HCL 2 MG/2ML IJ SOLN: 1.0000 mg | INTRAMUSCULAR | Status: DC | PRN

## 2017-09-13 MED ORDER — BUPIVACAINE HCL (PF) 0.5 % IJ SOLN
INTRAMUSCULAR | Status: DC | PRN
  Administered 2017-09-13: 20 mL

## 2017-09-13 MED ORDER — OXYCODONE HCL 5 MG/5ML PO SOLN: 5.0000 mg | Freq: Once | ORAL | Status: AC | PRN

## 2017-09-13 SURGICAL SUPPLY — 41 items
BANDAGE ADH SHEER 1  50/CT (GAUZE/BANDAGES/DRESSINGS) IMPLANT
BRIEF STRETCH FOR OB PAD XXL (UNDERPADS AND DIAPERS) ×3 IMPLANT
CLIP FILSHIE TUBAL LIGA STRL (Clip) ×2 IMPLANT
CLOSURE WOUND 1/2 X4 (GAUZE/BANDAGES/DRESSINGS) ×1
DRSG OPSITE POSTOP 3X4 (GAUZE/BANDAGES/DRESSINGS) ×2 IMPLANT
DURAPREP 26ML APPLICATOR (WOUND CARE) ×3 IMPLANT
ELECT REM PT RETURN 9FT ADLT (ELECTROSURGICAL) ×3
ELECTRODE REM PT RTRN 9FT ADLT (ELECTROSURGICAL) IMPLANT
GLOVE BIO SURGEON STRL SZ 6.5 (GLOVE) ×3 IMPLANT
GLOVE BIO SURGEONS STRL SZ 6.5 (GLOVE) ×2
GLOVE BIOGEL PI IND STRL 7.0 (GLOVE) ×2 IMPLANT
GLOVE BIOGEL PI INDICATOR 7.0 (GLOVE) ×4
GOWN STRL REUS W/ TWL LRG LVL3 (GOWN DISPOSABLE) IMPLANT
GOWN STRL REUS W/ TWL XL LVL3 (GOWN DISPOSABLE) ×1 IMPLANT
GOWN STRL REUS W/TWL LRG LVL3 (GOWN DISPOSABLE) ×4 IMPLANT
GOWN STRL REUS W/TWL XL LVL3 (GOWN DISPOSABLE) ×3
NEEDLE INSUFFLATION 120MM (ENDOMECHANICALS) ×3 IMPLANT
NS IRRIG 1000ML POUR BTL (IV SOLUTION) ×3 IMPLANT
PACK LAPAROSCOPY BASIN (CUSTOM PROCEDURE TRAY) ×3 IMPLANT
PACK TRENDGUARD 450 HYBRID PRO (MISCELLANEOUS) IMPLANT
PACK TRENDGUARD 600 HYBRD PROC (MISCELLANEOUS) IMPLANT
PAD OB MATERNITY 4.3X12.25 (PERSONAL CARE ITEMS) ×3 IMPLANT
PAD PREP 24X48 CUFFED NSTRL (MISCELLANEOUS) ×3 IMPLANT
PENCIL BUTTON HOLSTER BLD 10FT (ELECTRODE) ×2 IMPLANT
SHEARS HARMONIC ACE PLUS 36CM (ENDOMECHANICALS) IMPLANT
SLEEVE SCD COMPRESS KNEE MED (MISCELLANEOUS) ×3 IMPLANT
SLEEVE XCEL OPT CAN 5 100 (ENDOMECHANICALS) IMPLANT
STRIP CLOSURE SKIN 1/2X4 (GAUZE/BANDAGES/DRESSINGS) ×1 IMPLANT
SUT VICRYL 0 UR6 27IN ABS (SUTURE) ×3 IMPLANT
SUT VICRYL 4-0 PS2 18IN ABS (SUTURE) ×3 IMPLANT
SYSTEM CARTER THOMASON II (TROCAR) IMPLANT
TOWEL GREEN STERILE FF (TOWEL DISPOSABLE) ×6 IMPLANT
TRENDGUARD 450 HYBRID PRO PACK (MISCELLANEOUS)
TRENDGUARD 600 HYBRID PROC PK (MISCELLANEOUS)
TROCAR OPTI TIP 5M 100M (ENDOMECHANICALS) ×2 IMPLANT
TROCAR XCEL DIL TIP R 11M (ENDOMECHANICALS) ×3 IMPLANT
TUBING INSUF HEATED (TUBING) ×2 IMPLANT
TUBING INSUFFLATION (TUBING) ×1 IMPLANT
TUBING NON-CON 1/4 X 20 CONN (TUBING) ×1 IMPLANT
TUBING NON-CON 1/4 X 20' CONN (TUBING) ×1
YANKAUER SUCT BULB TIP NO VENT (SUCTIONS) ×2 IMPLANT

## 2017-09-13 NOTE — H&P (Signed)
Michelle [name] is an 37 y.o. female single G3 (2 months, 81 and 22 yo kids + 3 step kids) here for a laparoscopic application of Filsche clips. She had depo provera about a month ago. She is bottle feeding.     Menstrual History: Menarche age: 46 Patient's last menstrual period was 09/02/2017.    Past Medical History:  Diagnosis Date  . Depression   . History of pregnancy induced hypertension    states no longer on medication  . Polycystic kidney disease   . Wears partial dentures    upper    Past Surgical History:  Procedure Laterality Date  . CHOLECYSTECTOMY  02/11/2004    Family History  Problem Relation Age of Onset  . Diabetes Maternal Grandmother   . Cancer Maternal Grandmother        cervical  . Endometriosis Mother     Social History:  reports that she has been smoking. She has a 5.00 pack-year smoking history. She has never used smokeless tobacco. She reports that she drank alcohol. She reports that she does not use drugs.  Allergies: No Known Allergies  Medications Prior to Admission  Medication Sig Dispense Refill Last Dose  . medroxyPROGESTERone (DEPO-PROVERA) 150 MG/ML injection Inject 1 mL (150 mg total) into the muscle every 3 (three) months. 1 mL 0 More than a month at Unknown time    ROS  Not employed  Blood pressure 118/68, pulse 73, temperature 97.9 F (36.6 [name]), temperature source Oral, resp. rate 18, height 5' (1.524 m), weight 61 kg, last menstrual period 09/02/2017, SpO2 100 %, not currently breastfeeding. Physical Exam  Breathing, conversing, and ambulating normally Well nourished, well hydrated White female, no apparent distress Heart- rrr Lungs- CTAB Abd- benign  No results found for this or any previous visit (from the past 24 hour(s)).  No results found.  Assessment/Plan: Unwanted fertility- plan for laparoscopic application of Filsche clips. She understands that there is a 1 in 400 failure rate of the tubal ligation.  She  understands the risks of surgery, including, but not to infection, bleeding, DVTs, damage to bowel, bladder, ureters. She wishes to proceed.     Michelle [name] Michelle [name] 09/13/2017, 11:18 AM

## 2017-09-13 NOTE — Anesthesia Preprocedure Evaluation (Signed)
Anesthesia Evaluation  Patient identified by MRN, date of birth, ID band Patient awake    Reviewed: Allergy & Precautions, NPO status , Patient's Chart, lab work & pertinent test results  History of Anesthesia Complications Negative for: history of anesthetic complications  Airway Mallampati: I  TM Distance: >3 FB Neck ROM: Full    Dental  (+) Partial Upper   Pulmonary neg shortness of breath, neg COPD, neg recent URI, Current Smoker,    breath sounds clear to auscultation       Cardiovascular negative cardio ROS   Rhythm:Regular     Neuro/Psych negative neurological ROS  negative psych ROS   GI/Hepatic negative GI ROS, Neg liver ROS,   Endo/Other  negative endocrine ROS  Renal/GU negative Renal ROS     Musculoskeletal   Abdominal   Peds  Hematology negative hematology ROS (+)   Anesthesia Other Findings   Reproductive/Obstetrics                             Anesthesia Physical Anesthesia Plan  ASA: I  Anesthesia Plan: General   Post-op Pain Management:    Induction: Intravenous  PONV Risk Score and Plan: 2 and Ondansetron and Dexamethasone  Airway Management Planned: Oral ETT  Additional Equipment: None  Intra-op Plan:   Post-operative Plan: Extubation in OR  Informed Consent: I have reviewed the patients History and Physical, chart, labs and discussed the procedure including the risks, benefits and alternatives for the proposed anesthesia with the patient or authorized representative who has indicated his/her understanding and acceptance.   Dental advisory given  Plan Discussed with: CRNA and Surgeon  Anesthesia Plan Comments:         Anesthesia Quick Evaluation

## 2017-09-13 NOTE — Discharge Instructions (Signed)
Laparoscopic Tubal Ligation, Care After °Refer to this sheet in the next few weeks. These instructions provide you with information about caring for yourself after your procedure. Your health care provider may also give you more specific instructions. Your treatment has been planned according to current medical practices, but problems sometimes occur. Call your health care provider if you have any problems or questions after your procedure. °What can I expect after the procedure? °After the procedure, it is common to have: °· A sore throat. °· Discomfort in your shoulder. °· Mild discomfort or cramping in your abdomen. °· Gas pains. °· Pain or soreness in the area where the surgical cut (incision) was made. °· A bloated feeling. °· Tiredness. °· Nausea. °· Vomiting. ° °Follow these instructions at home: °Medicines °· Take over-the-counter and prescription medicines only as told by your health care provider. °· Do not take aspirin because it can cause bleeding. °· Do not drive or operate heavy machinery while taking prescription pain medicine. °Activity °· Rest for the rest of the day. °· Return to your normal activities as told by your health care provider. Ask your health care provider what activities are safe for you. °Incision care ° °· Follow instructions from your health care provider about how to take care of your incision. Make sure you: °? Wash your hands with soap and water before you change your bandage (dressing). If soap and water are not available, use hand sanitizer. °? Change your dressing as told by your health care provider. °? Leave stitches (sutures) in place. They may need to stay in place for 2 weeks or longer. °· Check your incision area every day for signs of infection. Check for: °? More redness, swelling, or pain. °? More fluid or blood. °? Warmth. °? Pus or a bad smell. °Other Instructions °· Do not take baths, swim, or use a hot tub until your health care provider approves. You may take  showers. °· Keep all follow-up visits as told by your health care provider. This is important. °· Have someone help you with your daily household tasks for the first few days. °Contact a health care provider if: °· You have more redness, swelling, or pain around your incision. °· Your incision feels warm to the touch. °· You have pus or a bad smell coming from your incision. °· The edges of your incision break open after the sutures have been removed. °· Your pain does not improve after 2-3 days. °· You have a rash. °· You repeatedly become dizzy or light-headed. °· Your pain medicine is not helping. °· You are constipated. °Get help right away if: °· You have a fever. °· You faint. °· You have increasing pain in your abdomen. °· You have severe pain in one or both of your shoulders. °· You have fluid or blood coming from your sutures or from your vagina. °· You have shortness of breath or difficulty breathing. °· You have chest pain or leg pain. °· You have ongoing nausea, vomiting, or diarrhea. °This information is not intended to replace advice given to you by your health care provider. Make sure you discuss any questions you have with your health care provider. °Document Released: 07/17/2004 Document Revised: 06/02/2015 Document Reviewed: 12/08/2014 °Elsevier Interactive Patient Education © 2018 [employer] ° ° °Post Anesthesia Home Care Instructions ° °Activity: °Get plenty of rest for the remainder of the day. A responsible individual must stay with you for 24 hours following the procedure.  °For the   next 24 hours, DO NOT: °-Drive a car °-Operate machinery °-Drink alcoholic beverages °-Take any medication unless instructed by your physician °-Make any legal decisions or sign important papers. ° °Meals: °Start with liquid foods such as gelatin or soup. Progress to regular foods as tolerated. Avoid greasy, spicy, heavy foods. If nausea and/or vomiting occur, drink only clear liquids until the nausea and/or  vomiting subsides. Call your physician if vomiting continues. ° °Special Instructions/Symptoms: °Your throat may feel dry or sore from the anesthesia or the breathing tube placed in your throat during surgery. If this causes discomfort, gargle with warm salt water. The discomfort should disappear within 24 hours. ° °If you had a scopolamine patch placed behind your ear for the management of post- operative nausea and/or vomiting: ° °1. The medication in the patch is effective for 72 hours, after which it should be removed.  Wrap patch in a tissue and discard in the trash. Wash hands thoroughly with soap and water. °2. You may remove the patch earlier than 72 hours if you experience unpleasant side effects which may include dry mouth, dizziness or visual disturbances. °3. Avoid touching the patch. Wash your hands with soap and water after contact with the patch. °  ° °

## 2017-09-13 NOTE — Op Note (Signed)
09/13/2017  12:23 PM  PATIENT:  Michelle [name]  37 y.o. female  PRE-OPERATIVE DIAGNOSIS:  Undesired Fertility  POST-OPERATIVE DIAGNOSIS:  Undesired Fertility  PROCEDURE:  Procedure(s): LAPAROSCOPIC TUBAL LIGATION - Filshie Clips (Bilateral)  SURGEON:  Surgeon(s) and Role:    * Vonya Ohalloran C, MD - Primary  ANESTHESIA:   local and general  EBL:  minimal   BLOOD ADMINISTERED:none  DRAINS: none   LOCAL MEDICATIONS USED:  MARCAINE     SPECIMEN:  No Specimen  DISPOSITION OF SPECIMEN:  N/A  COUNTS:  YES  TOURNIQUET:  * No tourniquets in log *  DICTATION: .Dragon Dictation  PLAN OF CARE: Discharge to home after PACU  PATIENT DISPOSITION:  PACU - hemodynamically stable.   Delay start of Pharmacological VTE agent (>24hrs) due to surgical blood loss or risk of bleeding: not applicable   The risks, benefits, alternatives of surgery were explained understood, accepted. All questions were answered. She understands the failure rate of 1/400. She declines alternative forms of birth control. Her urine pregnancy test was negative. She was taken to the operating room and general anesthesia was applied without complication. She was placed in dorsal lithotomy position. Her abdomen and vagina were prepped and draped in the usual sterile fashion. A time out procedure was done. A bimanual exam revealed a normal, size and shape mobile uterus with nonenlarged adnexa. A Hulka manipulator was placed on the cervix. Gloves were changed and attention was turned to the abdomen. Approximately 10 mL of 0.5% Marcaine was used to infiltrate the subcutaneous tissue in the left upper quadrant (she has a history of a laparoscopic cholecystectomy . A 1 cm incision was made. A Veres needle was placed in the pelvis. The patient pressure was already at 15 so I made a small incision in the umbilicus and placed the Veress needle there. Abdominal pressure was then 3. Low-flow CO2 was used to insufflate the abdomen to  approximately 3 L. After good pneumoperitoneum was established, a 5 mm trocar was placed. Laparoscopy confirmed correct placement. The upper abdomen and bowel looked normal as did the liver. I then placed the 11 mm port under direct laparoscopic visualization in the left upper quadrant. She was placed in the Trendelenburg position. Patient abdominal pressure was always less than 15. Her uterus ovaries and tubes appeared normal. There was no evidence of endometriosis. The oviducts were visually traced to their fimbriated end on each side. In the isthmic region of each oviduct, a Filshie clip was placed across the entire oviduct. There was no bleeding. The CO2 was allowed to escape from her abdomen. The left upper quadrant fascia was closed with a figure of 0 Vicryl suture. No defects were palpable. The subcuticular closure was done with 4-0 Vicryl suture at both of the incision sites. The Hulka manipulator was removed. She was extubated and taken to recovery in stable condition. She tolerated the procedure well. The instrument, sponge, and needle counts were correct.

## 2017-09-13 NOTE — Transfer of Care (Signed)
Immediate Anesthesia Transfer of Care Note  Patient: Michelle [name]  Procedure(s) Performed: LAPAROSCOPIC TUBAL LIGATION - Filshie Clips (Bilateral Abdomen)  Patient Location: PACU  Anesthesia Type:General  Level of Consciousness: awake, alert  and oriented  Airway & Oxygen Therapy: Patient Spontanous Breathing and Patient connected to face mask oxygen  Post-op Assessment: Report given to RN and Post -op Vital signs reviewed and stable  Post vital signs: Reviewed and stable  Last Vitals:  Vitals Value Taken Time  BP 119/81 09/13/2017 12:28 PM  Temp    Pulse 82 09/13/2017 12:30 PM  Resp 24 09/13/2017 12:30 PM  SpO2 100 % 09/13/2017 12:30 PM  Vitals shown include unvalidated device data.  Last Pain:  Vitals:   09/13/17 1015  TempSrc: Oral  PainSc: 0-No pain         Complications: No apparent anesthesia complications

## 2017-09-13 NOTE — Anesthesia Postprocedure Evaluation (Signed)
Anesthesia Post Note  Patient: Michelle [name]  Procedure(s) Performed: LAPAROSCOPIC TUBAL LIGATION - Filshie Clips (Bilateral Abdomen)     Patient location during evaluation: PACU Anesthesia Type: General Level of consciousness: awake and alert Pain management: pain level controlled Vital Signs Assessment: post-procedure vital signs reviewed and stable Respiratory status: spontaneous breathing, nonlabored ventilation and respiratory function stable Cardiovascular status: blood pressure returned to baseline and stable Postop Assessment: no apparent nausea or vomiting Anesthetic complications: no    Last Vitals:  Vitals:   09/13/17 1230 09/13/17 1402  BP: 122/79 136/84  Pulse: 82 67  Resp: (!) 24 18  Temp:  36.7 C  SpO2: 100% 100%    Last Pain:  Vitals:   09/13/17 1402  TempSrc:   PainSc: 4                  Lowella Curb

## 2017-09-13 NOTE — Anesthesia Procedure Notes (Signed)
Procedure Name: Intubation Date/Time: 09/13/2017 11:46 AM Performed by: Minerva Ends, CRNA Pre-anesthesia Checklist: Patient identified, Emergency Drugs available, Suction available and Patient being monitored Patient Re-evaluated:Patient Re-evaluated prior to induction Oxygen Delivery Method: Circle System Utilized Preoxygenation: Pre-oxygenation with 100% oxygen Induction Type: IV induction Ventilation: Mask ventilation without difficulty Laryngoscope Size: Miller and 2 Grade View: Grade I Tube type: Oral Number of attempts: 1 Airway Equipment and Method: Stylet Placement Confirmation: ETT inserted through vocal cords under direct vision,  positive ETCO2 and breath sounds checked- equal and bilateral Secured at: 7 cm Tube secured with: Tape Dental Injury: Teeth and Oropharynx as per pre-operative assessment  Comments: Smooth IV induction Miller-- intubation AM CRNA atraumatic teeth and mouth as preop-- upper plate out by Pt prior to OR-- OR RN has plate and will take to recovery--- upon inspection of mouth prior to laryngoscopy-- upper left tooth very loose and upper right chipped--- teeth and mouth unchanged with intubation-- bilat BS

## 2017-09-14 ENCOUNTER — Encounter (HOSPITAL_BASED_OUTPATIENT_CLINIC_OR_DEPARTMENT_OTHER): Payer: Self-pay | Admitting: Obstetrics & Gynecology

## 2019-11-28 ENCOUNTER — Other Ambulatory Visit: Payer: Self-pay

## 2019-11-28 ENCOUNTER — Other Ambulatory Visit (HOSPITAL_COMMUNITY)
Admission: RE | Admit: 2019-11-28 | Discharge: 2019-11-28 | Disposition: A | Discharge status: Home / Self Care | Payer: Medicaid Other | Source: Ambulatory Visit | Attending: Obstetrics and Gynecology | Admitting: Obstetrics and Gynecology

## 2019-11-28 ENCOUNTER — Ambulatory Visit: Payer: Medicaid Other | Admitting: Obstetrics and Gynecology

## 2019-11-28 ENCOUNTER — Encounter: Payer: Self-pay | Admitting: Obstetrics and Gynecology

## 2019-11-28 VITALS — BP 111/75 | HR 100 | Ht 60.0 in | Wt 128.0 lb

## 2019-11-28 DIAGNOSIS — Z124 Encounter for screening for malignant neoplasm of cervix: Secondary | ICD-10-CM | POA: Insufficient documentation

## 2019-11-28 DIAGNOSIS — Z3202 Encounter for pregnancy test, result negative: Secondary | ICD-10-CM | POA: Diagnosis not present

## 2019-11-28 DIAGNOSIS — N939 Abnormal uterine and vaginal bleeding, unspecified: Secondary | ICD-10-CM | POA: Insufficient documentation

## 2019-11-28 LAB — CBC
HCT: 41 % (ref 35.0–45.0)
Hemoglobin: 13.8 g/dL (ref 11.7–15.5)
MCH: 32 pg (ref 27.0–33.0)
MCHC: 33.7 g/dL (ref 32.0–36.0)
MCV: 95.1 fL (ref 80.0–100.0)
MPV: 10.7 fL (ref 7.5–12.5)
Platelets: 307 10*3/uL (ref 140–400)
RBC: 4.31 10*6/uL (ref 3.80–5.10)
RDW: 13.2 % (ref 11.0–15.0)
WBC: 8 10*3/uL (ref 3.8–10.8)

## 2019-11-28 LAB — POCT URINE PREGNANCY: Preg Test, Ur: NEGATIVE

## 2019-11-28 LAB — TSH: TSH: 1.06 mIU/L

## 2019-11-28 MED ORDER — MEDROXYPROGESTERONE ACETATE 10 MG PO TABS: 10.0000 mg | ORAL_TABLET | Freq: Every day | ORAL | 0 refills | Status: DC

## 2019-11-28 NOTE — Progress Notes (Signed)
GYNECOLOGY ENCOUNTER NOTE  History:     Michelle [name] is a 39 y.o. [phone] female here for abnormal vaginal bleeding. She reports abnormal bleeding for about 3 months. She reports a BLT 2.5 years ago. She reports regular monthly cycles.  The past 3 months she has had everyday bleeding that alternates between bright red and brown. The cycle starts out heavy and then goes to light. Some left sided ovarian pain. Hx of ovarian cysts. She reports the spotting does not stop. She has no pain. She does not notice an odor.    Gynecologic History Patient's last menstrual period was 10/12/2019. Contraception: tubal ligation Last Pap: unknown Last mammogram: NA  Obstetric History OB History  Gravida Para Term Preterm AB Living  4 4 4     4   SAB TAB Ectopic Multiple Live Births        0 4    # Outcome Date GA Lbr Len/2nd Weight Sex Delivery Anes PTL Lv  4 Term 07/08/17 [redacted]w[redacted]d 10:33 / 00:05 6 lb 9.3 oz (2.985 kg) F Vag-Spont EPI  LIV  3 Term      Vag-Spont   LIV  2 Term      Vag-Spont   LIV  1 Term      Vag-Spont   LIV    Past Medical History:  Diagnosis Date  . Depression   . History of pregnancy induced hypertension    states no longer on medication  . Polycystic kidney disease   . Wears partial dentures    upper    Past Surgical History:  Procedure Laterality Date  . CHOLECYSTECTOMY  02/11/2004  . LAPAROSCOPIC TUBAL LIGATION Bilateral 09/13/2017   Procedure: LAPAROSCOPIC TUBAL LIGATION - Filshie Clips;  Surgeon: 11/13/2017, MD;  Location: [facility];  Service: Gynecology;  Laterality: Bilateral;    No current outpatient medications on file prior to visit.   No current facility-administered medications on file prior to visit.    Allergies  Allergen Reactions  . Acetaminophen Shortness Of Breath    Social History:  reports that she has been smoking. She has a 5.00 pack-year smoking history. She has never used smokeless tobacco. She reports previous  alcohol use. She reports that she does not use drugs.  Family History  Problem Relation Age of Onset  . Diabetes Maternal Grandmother   . Cancer Maternal Grandmother        cervical  . Endometriosis Mother     The following portions of the patient's history were reviewed and updated as appropriate: allergies, current medications, past family history, past medical history, past social history, past surgical history and problem list.  Review of Systems Pertinent items noted in HPI and remainder of comprehensive ROS otherwise negative.  Physical Exam:  BP 111/75   Pulse 100   Ht 5' (1.524 m)   Wt 128 lb (58.1 kg)   LMP 10/12/2019   BMI 25.00 kg/m  CONSTITUTIONAL: Well-developed, well-nourished female in no acute distress.  HENT:  Normocephalic SKIN: Skin is warm and dry. MUSCULOSKELETAL: Normal range of motion. NEUROLOGIC: Alert and oriented to person, place, and time. PSYCHIATRIC: Normal mood and affect.  ABDOMEN: Soft, no distention noted.  No tenderness, rebound or guarding.  PELVIC: Normal appearing external genitalia and urethral meatus; normal appearing vaginal mucosa and cervix.  Scant amount of pink discharge, no odor. Normal uterine size, no other palpable masses, no uterine or adnexal tenderness.  Performed in the presence of a chaperone.  Assessment and Plan:   1. Abnormal vaginal bleeding  - Provera 10 mg x 10 days - Follow up in 6 weeks or after pelvic US - TSH - CBC - US PELVIC COMPLETE WITH TRANSVAGINAL; Future - POCT urine pregnancy - Cervicovaginal ancillary only( [facility])  2. Pap smear for cervical cancer screening  - Cytology - PAP( [place]) - She needs an annual and a mammogram     Maddalena Linarez, Harolyn Rutherford, NP Biochemist, clinical for [employer], [facility]

## 2019-11-28 NOTE — Progress Notes (Signed)
Pt's periods are lasting for three weeks at a time

## 2019-11-29 LAB — CERVICOVAGINAL ANCILLARY ONLY
Bacterial Vaginitis (gardnerella): POSITIVE — AB
Candida Glabrata: POSITIVE — AB
Candida Vaginitis: NEGATIVE
Chlamydia: NEGATIVE
Comment: NEGATIVE
Comment: NEGATIVE
Comment: NEGATIVE
Comment: NEGATIVE
Comment: NEGATIVE
Comment: NORMAL
Neisseria Gonorrhea: NEGATIVE
Trichomonas: NEGATIVE

## 2019-11-29 LAB — CYTOLOGY - PAP
Comment: NEGATIVE
Diagnosis: NEGATIVE
High risk HPV: NEGATIVE

## 2019-12-03 ENCOUNTER — Other Ambulatory Visit: Payer: Self-pay | Admitting: Obstetrics and Gynecology

## 2019-12-03 ENCOUNTER — Telehealth: Payer: Self-pay

## 2019-12-03 ENCOUNTER — Other Ambulatory Visit: Payer: Self-pay

## 2019-12-03 ENCOUNTER — Ambulatory Visit (INDEPENDENT_AMBULATORY_CARE_PROVIDER_SITE_OTHER): Payer: Medicaid Other

## 2019-12-03 ENCOUNTER — Telehealth: Payer: Self-pay | Admitting: Obstetrics and Gynecology

## 2019-12-03 DIAGNOSIS — N760 Acute vaginitis: Secondary | ICD-10-CM

## 2019-12-03 DIAGNOSIS — B379 Candidiasis, unspecified: Secondary | ICD-10-CM

## 2019-12-03 DIAGNOSIS — N939 Abnormal uterine and vaginal bleeding, unspecified: Secondary | ICD-10-CM

## 2019-12-03 DIAGNOSIS — B9689 Other specified bacterial agents as the cause of diseases classified elsewhere: Secondary | ICD-10-CM

## 2019-12-03 MED ORDER — FLUCONAZOLE 150 MG PO TABS: 150.0000 mg | ORAL_TABLET | Freq: Every day | ORAL | 0 refills | Status: DC

## 2019-12-03 MED ORDER — METRONIDAZOLE 500 MG PO TABS: 500.0000 mg | ORAL_TABLET | Freq: Two times a day (BID) | ORAL | 0 refills | Status: DC

## 2019-12-03 MED ORDER — METRONIDAZOLE 500 MG PO TABS: 500.0000 mg | ORAL_TABLET | Freq: Two times a day (BID) | ORAL | 0 refills | Status: AC

## 2019-12-03 NOTE — Telephone Encounter (Signed)
Michelle [name] sent Rx for Flagyl and Diflucan. Pt called stating Rx was sent to wrong pharmacy. Rx corrected and sent to correct pharmacy.

## 2019-12-03 NOTE — Telephone Encounter (Signed)
Error

## 2020-09-04 ENCOUNTER — Ambulatory Visit: Payer: Medicaid Other | Admitting: Obstetrics and Gynecology

## 2020-09-04 ENCOUNTER — Encounter: Payer: Self-pay | Admitting: Obstetrics and Gynecology

## 2020-09-04 ENCOUNTER — Other Ambulatory Visit: Payer: Self-pay

## 2020-09-04 VITALS — BP 116/82 | HR 81 | Ht 60.0 in | Wt 136.0 lb

## 2020-09-04 DIAGNOSIS — N3001 Acute cystitis with hematuria: Secondary | ICD-10-CM | POA: Diagnosis not present

## 2020-09-04 DIAGNOSIS — B3731 Acute candidiasis of vulva and vagina: Secondary | ICD-10-CM

## 2020-09-04 DIAGNOSIS — B373 Candidiasis of vulva and vagina: Secondary | ICD-10-CM

## 2020-09-04 DIAGNOSIS — N83202 Unspecified ovarian cyst, left side: Secondary | ICD-10-CM | POA: Diagnosis not present

## 2020-09-04 MED ORDER — SULFAMETHOXAZOLE-TRIMETHOPRIM 800-160 MG PO TABS: 1.0000 | ORAL_TABLET | Freq: Two times a day (BID) | ORAL | 0 refills | Status: DC

## 2020-09-04 MED ORDER — FLUCONAZOLE 150 MG PO TABS: 150.0000 mg | ORAL_TABLET | ORAL | 3 refills | Status: DC

## 2020-09-04 NOTE — Progress Notes (Signed)
   GYNECOLOGY OFFICE VISIT NOTE  History:   Michelle [name] is a 40 y.o. [phone] here today for follow up from the ER. She presented to the ER with 10/10 pain on 8/20. She waited in the ER for 7 hours and had an [place], labwork, Ucx and GC/CT. Her US shows a 1.7 cm complex cyst. She thinks this has been there since she was a teenager.  She also was noted to have a yeast infection and UTI. She had rocephin and this alleviated her pain symptoms ultimately but knows she has not completed the antibiotic course for the UTI.  GC/CT were negative.   She denies any abnormal vaginal discharge, bleeding, pelvic pain or other concerns.   She is up to date on her annual exam. Due later this year.   The following portions of the patient's history were reviewed and updated as appropriate: allergies, current medications, past family history, past medical history, past social history, past surgical history and problem list.   Health Maintenance:  Normal pap and negative HRHPV on 11/2019.  She has not yet had a mammogram.   Review of Systems:  Pertinent items noted in HPI and remainder of comprehensive ROS otherwise negative.  Physical Exam:  BP 116/82   Pulse 81   Ht 5' (1.524 m)   Wt 136 lb (61.7 kg)   LMP 08/20/2020 (Approximate)   BMI 26.56 kg/m  CONSTITUTIONAL: Well-developed, well-nourished female in no acute distress.  HEENT:  Normocephalic, atraumatic. External right and left ear normal. No scleral icterus.  NECK: Normal range of motion, supple, no masses noted on observation SKIN: No rash noted. Not diaphoretic. No erythema. No pallor. MUSCULOSKELETAL: Normal range of motion. No edema noted. NEUROLOGIC: Alert and oriented to person, place, and time. Normal muscle tone coordination. No cranial nerve deficit noted. PSYCHIATRIC: Normal mood and affect. Normal behavior. Normal judgment and thought content.  She declined pelvic exam.   Labs and Imaging I reviewed her labs, imaging, and documents  from the ER from 8/20 in Care Everywhere  Assessment and Plan:    1. Acute cystitis with hematuria - Reviewed e.coli UTI from the ED. Will start 3day course of abx since uncomplicated UTI - sulfamethoxazole-trimethoprim (BACTRIM DS) 800-160 MG tablet; Take 1 tablet by mouth 2 (two) times daily for 3 days.  Dispense: 6 tablet; Refill: 0  2. Left ovarian cyst - Potentially has been there long term but given the finding, would still do short term follow up.  - Pt is agreeable to this plan - US PELVIC COMPLETE WITH TRANSVAGINAL; Future  3. Yeast infection involving the vagina and surrounding area - Noted as well from wet prep in the ED. Will do Rx for this since present and ABX given as well.  - fluconazole (DIFLUCAN) 150 MG tablet; Take 1 tablet (150 mg total) by mouth every 3 (three) days. For three doses  Dispense: 3 tablet; Refill: 3  Routine preventative health maintenance measures emphasized. Please refer to After Visit Summary for other counseling recommendations.   Return in about 3 months (around 12/05/2020), or f/u US and annual.    I spent 20 minutes dedicated to the care of this patient including pre-visit review of records, face to face time with the patient discussing her conditions and treatments and post visit orders.   Milas Hock, MD, FACOG Obstetrician & Gynecologist, [facility], [facility]

## 2020-09-05 ENCOUNTER — Encounter: Payer: Self-pay | Admitting: Family Medicine

## 2020-09-05 ENCOUNTER — Emergency Department (INDEPENDENT_AMBULATORY_CARE_PROVIDER_SITE_OTHER)
Admission: EM | Admit: 2020-09-05 | Discharge: 2020-09-05 | Disposition: A | Discharge status: Home / Self Care | Payer: Medicaid Other | Source: Home / Self Care

## 2020-09-05 ENCOUNTER — Other Ambulatory Visit: Payer: Self-pay

## 2020-09-05 DIAGNOSIS — N3001 Acute cystitis with hematuria: Secondary | ICD-10-CM | POA: Diagnosis not present

## 2020-09-05 MED ORDER — FLUCONAZOLE 150 MG PO TABS: ORAL_TABLET | ORAL | 0 refills | Status: AC

## 2020-09-05 MED ORDER — SULFAMETHOXAZOLE-TRIMETHOPRIM 800-160 MG PO TABS: 1.0000 | ORAL_TABLET | Freq: Two times a day (BID) | ORAL | 0 refills | Status: AC

## 2020-09-05 NOTE — ED Provider Notes (Signed)
[facility]    CSN: [id number] Arrival date & time: 09/05/20  1734      History   Chief Complaint Chief Complaint  Patient presents with   Dysuria    HPI Michelle [name] is a 40 y.o. female.   HPI 40 year old female presents with dysuria and was seen [facility] on 08/30/2020 for pelvic pain was tested for vaginitis with wet prep, positive for yeast and urine culture revealing colony count greater than 100,000 of E. coli.  Patient reports presented yesterday to [facility]; however, prescriptions were never called into her pharmacy.  Past Medical History:  Diagnosis Date   Depression    History of pregnancy induced hypertension    states no longer on medication   Ovarian cyst    Polycystic kidney disease    Wears partial dentures    upper    Patient Active Problem List   Diagnosis Date Noted   Postpartum hemorrhage 07/09/2017   Pre-eclampsia affecting childbirth 07/09/2017    Past Surgical History:  Procedure Laterality Date   CHOLECYSTECTOMY  02/11/2004   LAPAROSCOPIC TUBAL LIGATION Bilateral 09/13/2017   Procedure: LAPAROSCOPIC TUBAL LIGATION - Filshie Clips;  Surgeon: Allie Bossier, MD;  Location: [facility];  Service: Gynecology;  Laterality: Bilateral;    OB History     Gravida  4   Para  4   Term  4   Preterm      AB      Living  4      SAB      IAB      Ectopic      Multiple  0   Live Births  4            Home Medications    Prior to Admission medications   Medication Sig Start Date End Date Taking? Authorizing Provider  fluconazole (DIFLUCAN) 150 MG tablet Take 1 tab p.o. now, may repeat 1 tab p.o. in 3 days if symptoms are not resolved. 09/05/20  Yes Trevor Iha, FNP  sulfamethoxazole-trimethoprim (BACTRIM DS) 800-160 MG tablet Take 1 tablet by mouth 2 (two) times daily for 7 days. 09/05/20 09/12/20 Yes Trevor Iha, FNP    Family History Family History  Problem Relation Age of Onset    Diabetes Maternal Grandmother    Cancer Maternal Grandmother        cervical   Endometriosis Mother     Social History Social History   Tobacco Use   Smoking status: Every Day    Packs/day: 0.50    Years: 10.00    Pack years: 5.00    Types: Cigarettes   Smokeless tobacco: Never  Vaping Use   Vaping Use: Never used  Substance Use Topics   Alcohol use: Not Currently   Drug use: Never     Allergies   Acetaminophen   Review of Systems Review of Systems  Genitourinary:  Positive for dysuria.       Vaginal itching    Physical Exam Triage Vital Signs ED Triage Vitals  Enc Vitals Group     BP      Pulse      Resp      Temp      Temp src      SpO2      Weight      Height      Head Circumference      Peak Flow      Pain Score  Pain Loc      Pain Edu?      Excl. in GC?    No data found.  Updated Vital Signs BP 127/86 (BP Location: Right Arm)   Pulse 80   Temp 98.7 F (37.1 C) (Oral)   Resp 16   Ht 5' (1.524 m)   Wt 134 lb 7.7 oz (61 kg)   LMP 08/20/2020 (Approximate)   SpO2 97%   BMI 26.26 kg/m   Physical Exam Vitals and nursing note reviewed.  Constitutional:      Appearance: Normal appearance. She is normal weight.  HENT:     Head: Normocephalic and atraumatic.     Mouth/Throat:     Mouth: Mucous membranes are moist.     Pharynx: Oropharynx is clear.  Eyes:     Extraocular Movements: Extraocular movements intact.     Conjunctiva/sclera: Conjunctivae normal.     Pupils: Pupils are equal, round, and reactive to light.  Cardiovascular:     Rate and Rhythm: Normal rate and regular rhythm.     Pulses: Normal pulses.     Heart sounds: Normal heart sounds.  Pulmonary:     Effort: Pulmonary effort is normal.     Breath sounds: Normal breath sounds. No wheezing, rhonchi or rales.  Abdominal:     Tenderness: There is no right CVA tenderness or left CVA tenderness.  Musculoskeletal:        General: Normal range of motion.     Cervical back:  Normal range of motion and neck supple.  Skin:    General: Skin is warm and dry.  Neurological:     General: No focal deficit present.     Mental Status: She is alert and oriented to person, place, and time.  Psychiatric:        Mood and Affect: Mood normal.        Behavior: Behavior normal.     UC Treatments / Results  Labs (all labs ordered are listed, but only abnormal results are displayed) Labs Reviewed - No data to display  EKG   Radiology No results found.  Procedures Procedures (including critical care time)  Medications Ordered in UC Medications - No data to display  Initial Impression / Assessment and Plan / UC Course  I have reviewed the triage vital signs and the nursing notes.  Pertinent labs & imaging results that were available during my care of the patient were reviewed by me and considered in my medical decision making (see chart for details).     MDM: 1.  Acute cystitis with hematuria-Rx'd Bactrim; 2.  Vaginal candidiasis-Rx'd Diflucan.Instructed/advised patient to take medication as directed with food to completion.  Encouraged patient to increase daily water intake while taking these medications.  Patient discharged home, hemodynamically stable.  Final Clinical Impressions(s) / UC Diagnoses   Final diagnoses:  Acute cystitis with hematuria     Discharge Instructions      Instructed/advised patient to take medication as directed with food to completion.  Encouraged patient to increase daily water intake while taking these medications.     ED Prescriptions     Medication Sig Dispense Auth. Provider   sulfamethoxazole-trimethoprim (BACTRIM DS) 800-160 MG tablet Take 1 tablet by mouth 2 (two) times daily for 7 days. 14 tablet Trevor Iha, FNP   fluconazole (DIFLUCAN) 150 MG tablet Take 1 tab p.o. now, may repeat 1 tab p.o. in 3 days if symptoms are not resolved. 3 tablet Trevor Iha, FNP  PDMP not reviewed this encounter.    Trevor Iha, FNP 09/05/20 1812

## 2020-09-05 NOTE — ED Triage Notes (Signed)
Upon chart review it appears her rxs were sent to the wrong pharmacy.

## 2020-09-05 NOTE — ED Triage Notes (Signed)
Patient here as follow up to visit in ED/[facility] on 08/30/20 for pelvic pain and was tested for vaginitis with wet prep positive for yeast; and urine culture revealing > 100,000 col E.Coli. Yesterday went to [facility] and her labs were reviewed with suggestions for Rxs which did not get called.

## 2020-09-05 NOTE — Discharge Instructions (Addendum)
Instructed/advised patient to take medication as directed with food to completion.  Encouraged patient to increase daily water intake while taking these medications.

## 2020-12-09 ENCOUNTER — Ambulatory Visit (INDEPENDENT_AMBULATORY_CARE_PROVIDER_SITE_OTHER): Payer: Medicaid Other

## 2020-12-09 ENCOUNTER — Other Ambulatory Visit: Payer: Self-pay

## 2020-12-09 DIAGNOSIS — N83202 Unspecified ovarian cyst, left side: Secondary | ICD-10-CM

## 2020-12-10 NOTE — Progress Notes (Signed)
GYNECOLOGY ANNUAL PREVENTATIVE CARE ENCOUNTER NOTE  History:     Michelle [name] is a 40 y.o. [phone] female here for a routine annual gynecologic exam.  Current complaints: None - she is improved in her symptoms.      Follow up US showed resolution of the small 1.7 cm cyst.  Denies abnormal vaginal bleeding, discharge, pelvic pain, problems with intercourse or other gynecologic concerns.     Gynecologic History No LMP recorded. Contraception: tubal ligation Last Pap: 11/2019. Result was normal with normal HPV Last Mammogram: not yet had   Obstetric History OB History  Gravida Para Term Preterm AB Living  4 4 4     4   SAB IAB Ectopic Multiple Live Births        0 4    # Outcome Date GA Lbr Len/2nd Weight Sex Delivery Anes PTL Lv  4 Term 07/08/17 [redacted]w[redacted]d 10:33 / 00:05 6 lb 9.3 oz (2.985 kg) F Vag-Spont EPI  LIV  3 Term      Vag-Spont   LIV  2 Term      Vag-Spont   LIV  1 Term      Vag-Spont   LIV    Past Medical History:  Diagnosis Date   Depression    History of pregnancy induced hypertension    states no longer on medication   Ovarian cyst    Polycystic kidney disease    Wears partial dentures    upper    Past Surgical History:  Procedure Laterality Date   CHOLECYSTECTOMY  02/11/2004   LAPAROSCOPIC TUBAL LIGATION Bilateral 09/13/2017   Procedure: LAPAROSCOPIC TUBAL LIGATION - Filshie Clips;  Surgeon: 11/13/2017, MD;  Location: [facility];  Service: Gynecology;  Laterality: Bilateral;    Current Outpatient Medications on File Prior to Visit  Medication Sig Dispense Refill   fluconazole (DIFLUCAN) 150 MG tablet Take 1 tab p.o. now, may repeat 1 tab p.o. in 3 days if symptoms are not resolved. 3 tablet 0   No current facility-administered medications on file prior to visit.    Allergies  Allergen Reactions   Acetaminophen Shortness Of Breath    Social History:  reports that she has been smoking. She has a 5.00 pack-year smoking history.  She has never used smokeless tobacco. She reports that she does not currently use alcohol. She reports that she does not use drugs.  Family History  Problem Relation Age of Onset   Diabetes Maternal Grandmother    Cancer Maternal Grandmother        cervical   Endometriosis Mother     The following portions of the patient's history were reviewed and updated as appropriate: allergies, current medications, past family history, past medical history, past social history, past surgical history and problem list.  Review of Systems Pertinent items noted in HPI and remainder of comprehensive ROS otherwise negative.  Physical Exam:  There were no vitals taken for this visit. CONSTITUTIONAL: Well-developed, well-nourished female in no acute distress.  HENT:  Normocephalic, atraumatic, External right and left ear normal.  EYES: Conjunctivae and EOM are normal. Pupils are equal, round, and reactive to light. No scleral icterus.  NECK: Normal range of motion, supple, no masses.  Normal thyroid.  SKIN: Skin is warm and dry. No rash noted. Not diaphoretic. No erythema. No pallor. MUSCULOSKELETAL: Normal range of motion. No tenderness.  No cyanosis, clubbing, or edema. NEUROLOGIC: Alert and oriented to person, place, and time. Normal reflexes, muscle tone coordination.  PSYCHIATRIC: Normal mood and affect. Normal behavior. Normal judgment and thought content.  CARDIOVASCULAR: Normal heart rate noted, regular rhythm RESPIRATORY: Clear to auscultation bilaterally. Effort and breath sounds normal, no problems with respiration noted.  BREASTS: Symmetric in size. No masses, tenderness, skin changes, nipple drainage, or lymphadenopathy bilaterally. Performed in the presence of a chaperone. ABDOMEN: Soft, no distention noted.  No tenderness, rebound or guarding.  PELVIC: External genitalia normal, Vagina normal without discharge, Urethra without abnormality or discharge, no bladder tenderness, cervix normal  in appearance, no CMT, uterus normal size, shape, and consistency, no adnexal masses or tenderness. Performed in the presence of a chaperone.  Assessment and Plan:    1. Encounter for annual routine gynecological examination - Cervical cancer screening: Discussed guidelines. Pap with HPV normal 11/2019 - Gardasil:  Has not yet had. Counseling provided. She will consider.  - STD Testing: declines - Birth Control:  BTL - Breast Health: Encouraged self breast awareness/SBE. Teaching provided. Discussed limits of clinical breast exam for detecting breast cancer. Rx given for MXR - She was offered and decline flu shot.  - F/U 12 months and prn   Routine preventative health maintenance measures emphasized. Please refer to After Visit Summary for other counseling recommendations.     Milas Hock, MD, FACOG Obstetrician & Gynecologist, [facility] for [facility], [facility]

## 2020-12-10 NOTE — Progress Notes (Signed)
Last pap- 11/28/19- negative Pt declined flu shot

## 2020-12-11 ENCOUNTER — Other Ambulatory Visit: Payer: Self-pay

## 2020-12-11 ENCOUNTER — Encounter: Payer: Self-pay | Admitting: Obstetrics and Gynecology

## 2020-12-11 ENCOUNTER — Ambulatory Visit (INDEPENDENT_AMBULATORY_CARE_PROVIDER_SITE_OTHER): Payer: Medicaid Other | Admitting: Obstetrics and Gynecology

## 2020-12-11 VITALS — BP 120/76 | HR 88 | Resp 16 | Ht 60.0 in | Wt 135.0 lb

## 2020-12-11 DIAGNOSIS — Z01419 Encounter for gynecological examination (general) (routine) without abnormal findings: Secondary | ICD-10-CM

## 2020-12-17 ENCOUNTER — Other Ambulatory Visit: Payer: Self-pay | Admitting: Obstetrics and Gynecology

## 2020-12-17 DIAGNOSIS — Z1231 Encounter for screening mammogram for malignant neoplasm of breast: Secondary | ICD-10-CM

## 2021-01-22 ENCOUNTER — Ambulatory Visit
Admission: RE | Admit: 2021-01-22 | Discharge: 2021-01-22 | Disposition: A | Discharge status: Home / Self Care | Payer: Medicaid Other | Source: Ambulatory Visit | Attending: Obstetrics and Gynecology | Admitting: Obstetrics and Gynecology

## 2021-01-22 DIAGNOSIS — Z1231 Encounter for screening mammogram for malignant neoplasm of breast: Secondary | ICD-10-CM
